# Patient Record
Sex: Female | Born: 1965 | Race: White | Hispanic: No | Marital: Single | Smoking: Former smoker
Health system: Southern US, Academic
[De-identification: ages and names within clinical notes are randomized; demographics above are authoritative.]

## PROBLEM LIST (undated history)

## (undated) DIAGNOSIS — H9313 Tinnitus, bilateral: Secondary | ICD-10-CM

## (undated) DIAGNOSIS — E559 Vitamin D deficiency, unspecified: Secondary | ICD-10-CM

## (undated) DIAGNOSIS — I82409 Acute embolism and thrombosis of unspecified deep veins of unspecified lower extremity: Secondary | ICD-10-CM

## (undated) DIAGNOSIS — M8580 Other specified disorders of bone density and structure, unspecified site: Secondary | ICD-10-CM

## (undated) DIAGNOSIS — M778 Other enthesopathies, not elsewhere classified: Secondary | ICD-10-CM

## (undated) DIAGNOSIS — E039 Hypothyroidism, unspecified: Secondary | ICD-10-CM

## (undated) HISTORY — DX: Acute embolism and thrombosis of unspecified deep veins of unspecified lower extremity: I82.409

## (undated) HISTORY — PX: URETHRAL DILATION: SUR417

## (undated) HISTORY — DX: Other enthesopathies, not elsewhere classified: M77.8

## (undated) HISTORY — DX: Tinnitus, bilateral: H93.13

## (undated) HISTORY — PX: HX DILATION AND CURETTAGE: SHX78

---

## 1998-05-13 ENCOUNTER — Other Ambulatory Visit (HOSPITAL_COMMUNITY): Payer: Self-pay | Admitting: Family Medicine

## 2018-08-27 HISTORY — PX: COLONOSCOPY: WVUENDOPRO10

## 2018-08-27 HISTORY — PX: ESOPHAGOGASTRODUODENOSCOPY: SHX1529

## 2020-03-25 IMAGING — CR XRAY LUMBAR SPINE COMPLETE
1 series · 7 of 7 positions shown · non-contrast
Comparison: None available.

﻿EXAM:  XRAY LUMBAR SPINE COMPLETE
INDICATION: Chronic lower back pain.

[Series 1: view not recorded · 0.17mm/px · 7 of 7 slices shown]
[im 1/7]
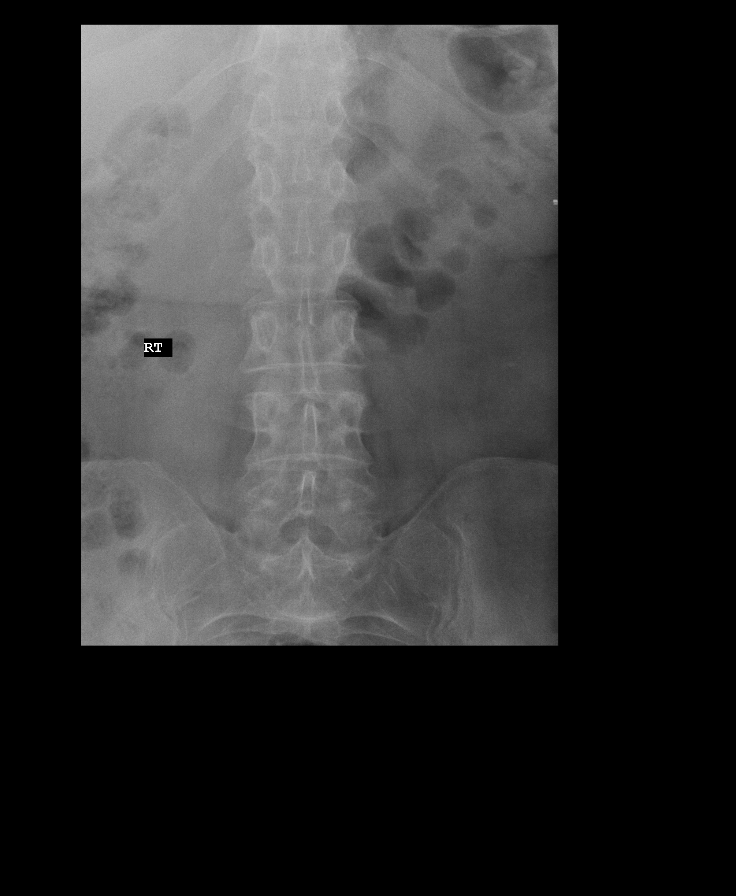
[im 2/7]
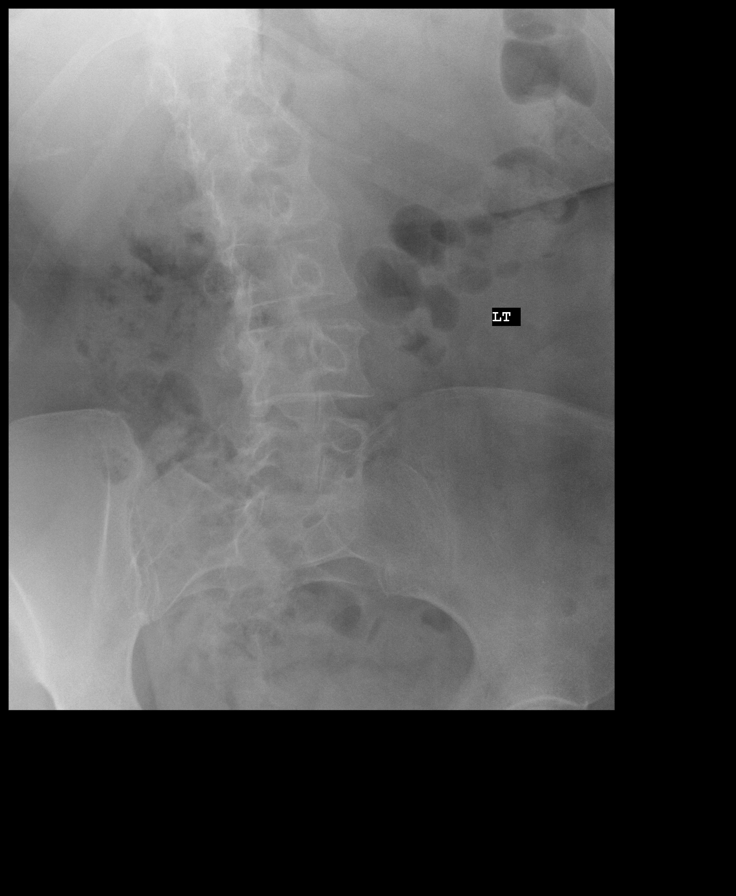
[im 3/7]
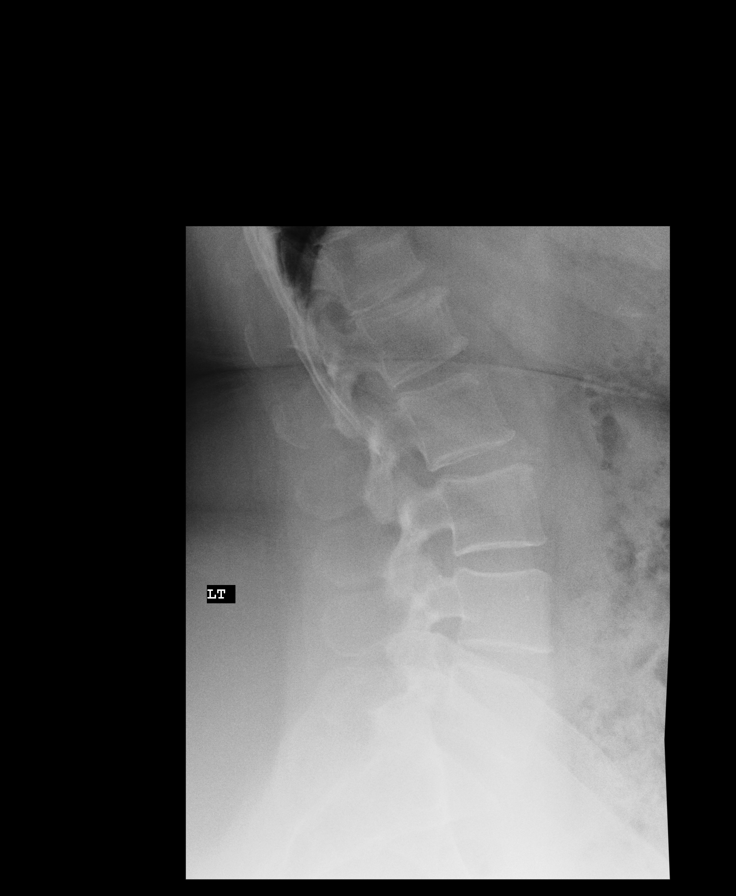
[im 4/7]
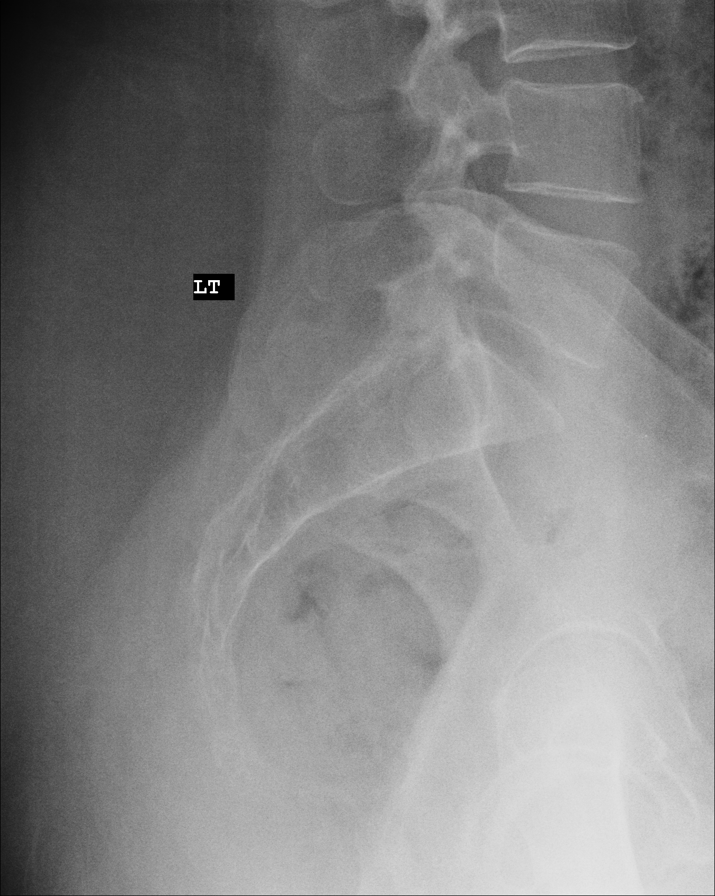
[im 5/7]
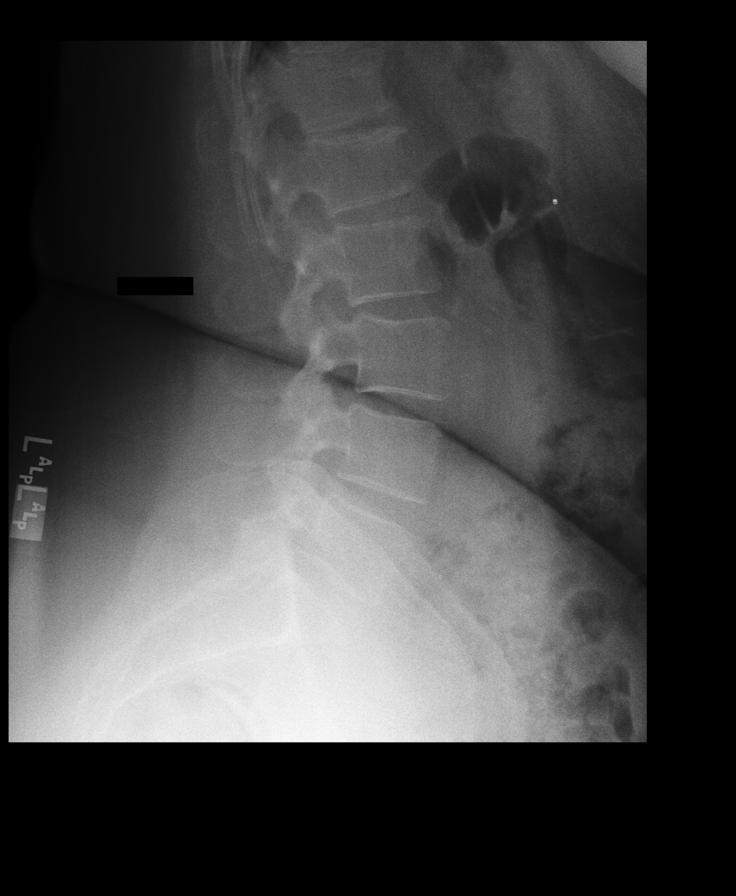
[im 6/7]
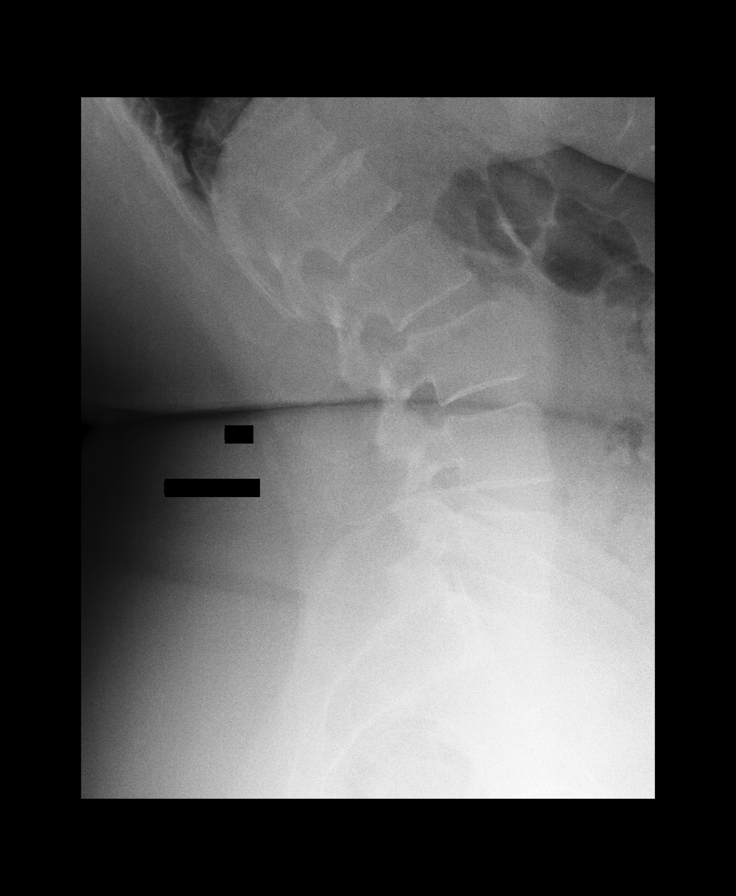
[im 7/7]
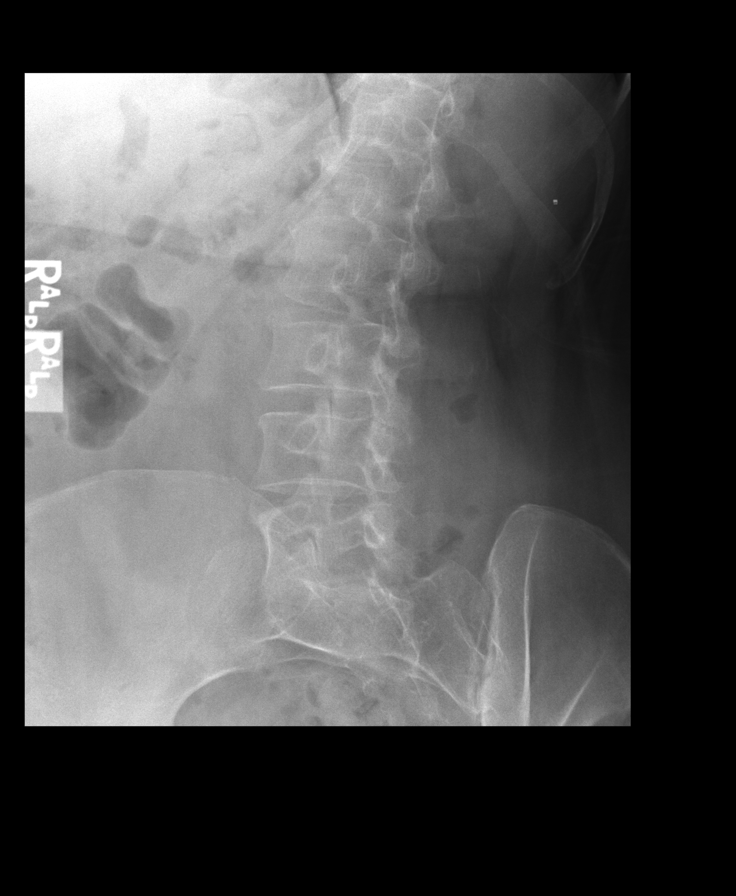

[7 of 7 positions shown; findings below may reference images not displayed]

FINDINGS: Vertebral bodies are normal in height and alignment. There is no acute fracture or subluxation. Disc spaces are well maintained. There is mild facet arthropathy within the lower lumbar spine. There is no definite abnormality on the flexion and extension views. Paraspinal soft tissues are also unremarkable.
IMPRESSION: Unremarkable exam.

## 2021-09-01 ENCOUNTER — Other Ambulatory Visit (INDEPENDENT_AMBULATORY_CARE_PROVIDER_SITE_OTHER): Payer: Self-pay | Admitting: NURSE PRACTITIONER

## 2021-09-13 ENCOUNTER — Encounter (INDEPENDENT_AMBULATORY_CARE_PROVIDER_SITE_OTHER): Payer: Self-pay | Admitting: Family

## 2021-09-13 DIAGNOSIS — M810 Age-related osteoporosis without current pathological fracture: Secondary | ICD-10-CM

## 2021-09-13 DIAGNOSIS — N644 Mastodynia: Secondary | ICD-10-CM

## 2021-09-13 DIAGNOSIS — N943 Premenstrual tension syndrome: Secondary | ICD-10-CM | POA: Insufficient documentation

## 2021-09-13 DIAGNOSIS — E785 Hyperlipidemia, unspecified: Secondary | ICD-10-CM | POA: Insufficient documentation

## 2021-09-13 DIAGNOSIS — I10 Essential (primary) hypertension: Secondary | ICD-10-CM | POA: Insufficient documentation

## 2021-09-13 DIAGNOSIS — K219 Gastro-esophageal reflux disease without esophagitis: Secondary | ICD-10-CM | POA: Insufficient documentation

## 2021-09-13 DIAGNOSIS — E669 Obesity, unspecified: Secondary | ICD-10-CM

## 2021-09-13 DIAGNOSIS — M545 Low back pain, unspecified: Secondary | ICD-10-CM | POA: Insufficient documentation

## 2021-09-13 DIAGNOSIS — M5410 Radiculopathy, site unspecified: Secondary | ICD-10-CM

## 2021-09-13 DIAGNOSIS — E782 Mixed hyperlipidemia: Secondary | ICD-10-CM | POA: Insufficient documentation

## 2021-09-13 DIAGNOSIS — G5601 Carpal tunnel syndrome, right upper limb: Secondary | ICD-10-CM

## 2021-09-13 DIAGNOSIS — B351 Tinea unguium: Secondary | ICD-10-CM

## 2021-09-13 DIAGNOSIS — G540 Brachial plexus disorders: Secondary | ICD-10-CM

## 2021-09-13 DIAGNOSIS — E039 Hypothyroidism, unspecified: Secondary | ICD-10-CM | POA: Insufficient documentation

## 2021-09-13 DIAGNOSIS — L659 Nonscarring hair loss, unspecified: Secondary | ICD-10-CM

## 2021-09-13 DIAGNOSIS — R7303 Prediabetes: Secondary | ICD-10-CM

## 2021-09-15 ENCOUNTER — Other Ambulatory Visit (INDEPENDENT_AMBULATORY_CARE_PROVIDER_SITE_OTHER): Payer: Medicaid Other

## 2021-09-15 ENCOUNTER — Ambulatory Visit (INDEPENDENT_AMBULATORY_CARE_PROVIDER_SITE_OTHER): Payer: Medicaid Other | Admitting: Family

## 2021-09-15 ENCOUNTER — Encounter (INDEPENDENT_AMBULATORY_CARE_PROVIDER_SITE_OTHER): Payer: Self-pay | Admitting: Family

## 2021-09-15 ENCOUNTER — Other Ambulatory Visit: Payer: Self-pay

## 2021-09-15 VITALS — BP 148/92 | HR 76 | Temp 97.7°F | Ht 67.0 in | Wt 252.1 lb

## 2021-09-15 DIAGNOSIS — Z780 Asymptomatic menopausal state: Secondary | ICD-10-CM

## 2021-09-15 DIAGNOSIS — F419 Anxiety disorder, unspecified: Secondary | ICD-10-CM

## 2021-09-15 DIAGNOSIS — M8580 Other specified disorders of bone density and structure, unspecified site: Secondary | ICD-10-CM

## 2021-09-15 DIAGNOSIS — Z6839 Body mass index (BMI) 39.0-39.9, adult: Secondary | ICD-10-CM

## 2021-09-15 DIAGNOSIS — E039 Hypothyroidism, unspecified: Secondary | ICD-10-CM

## 2021-09-15 DIAGNOSIS — R739 Hyperglycemia, unspecified: Secondary | ICD-10-CM

## 2021-09-15 MED ORDER — PANTOPRAZOLE 40 MG TABLET,DELAYED RELEASE
40.0000 mg | DELAYED_RELEASE_TABLET | Freq: Every day | ORAL | 1 refills | Status: DC
Start: 2021-09-15 — End: 2022-03-14

## 2021-09-15 MED ORDER — FAMOTIDINE 40 MG TABLET
40.0000 mg | ORAL_TABLET | Freq: Every evening | ORAL | 2 refills | Status: DC
Start: 2021-09-15 — End: 2022-03-29

## 2021-09-15 MED ORDER — HYDROXYZINE PAMOATE 50 MG CAPSULE
50.0000 mg | ORAL_CAPSULE | Freq: Two times a day (BID) | ORAL | 2 refills | Status: DC | PRN
Start: 2021-09-15 — End: 2022-05-31

## 2021-09-15 NOTE — Progress Notes (Signed)
FAMILY MEDICINE, MEDICAL OFFICE BUILDING  [address]  [place] [address]          Name: Dominique Carrillo MRN:  [id number]   Date: 09/15/2021 Age: 56 y.o.          Provider: Elvera Bicker, FNP-BC    Reason for visit: Follow Up 4 Months      History of Present Illness:  Dominique Carrillo is a 56 y.o. female presenting with worsening GERD.  Patient reports that she is having to take her famotidine earlier in the day.  Continues to use omeprazole 20 mg once daily in the morning.  Patient reports that she did even take 2 omeprazole yesterday without any relief.    Patient also reports that she continues to take hydroxyzine b.i.d. p.r.n. for anxiety as well as allergy issues.  Patient states she has good control with this medication.    05/14/2021: 56 year old female presents for CDM. occ has restless legs but not all the time. No new labs for review. The blood pressure 134/85. The insurance sent her a letter that states if we advise on it she may be approved. She has lost 5 pounds. GERD controlled. she had stomach upset with last antibiotic but it did finally resolved on its own.      Historical Data    Past Medical History:  Past Medical History:   Diagnosis Date   . DVT (deep venous thrombosis) (CMS HCC)     Left Calf   . Tendonitis of wrist, right    . Tinnitus of both ears       Past Surgical History:  Past Surgical History:   Procedure Laterality Date   . COLONOSCOPY  08/27/2018    Dr. Abbe Amsterdam   . ESOPHAGOGASTRODUODENOSCOPY  08/27/2018    Dr. Abbe Amsterdam   . HX DILATION AND CURETTAGE     . URETHRAL DILATION      when 56 years old      Allergies:  Allergies   Allergen Reactions   . Amoxicillin  Other Adverse Reaction (Add comment)     Mouth Blisters/Swelling   . Augmentin [Amoxicillin-Pot Clavulanate]      Blisters in mouth   . Propoxyphene Nausea/ Vomiting     Medications:  Current Outpatient Medications   Medication Sig   . aspirin 81 mg Oral Tablet, Chewable Chew 1 Tablet (81 mg total) Once a day   .  cetirizine (ZYRTEC) 10 mg Oral Tablet Take 1 Tablet (10 mg total) by mouth Once a day   . ergocalciferol, vitamin D2, (DRISDOL) 1,250 mcg (50,000 unit) Oral Capsule Take 1 Capsule (50,000 Units total) by mouth Every 7 days   . famotidine (PEPCID) 40 mg Oral Tablet Take 1 Tablet (40 mg total) by mouth Every evening   . fluticasone propionate (FLONASE) 50 mcg/actuation Nasal Spray, Suspension Administer 2 Sprays into each nostril Once a day   . hydrOXYzine pamoate (VISTARIL) 50 mg Oral Capsule Take 1 Capsule (50 mg total) by mouth Twice per day as needed for Anxiety   . levothyroxine (SYNTHROID) 75 mcg Oral Tablet Take 1 Tablet (75 mcg total) by mouth Every morning   . pantoprazole (PROTONIX) 40 mg Oral Tablet, Delayed Release (E.C.) Take 1 Tablet (40 mg total) by mouth Once a day   . rosuvastatin (CRESTOR) 10 mg Oral Tablet Take 1 Tablet (10 mg total) by mouth Once a day   . spironolacton-hydrochlorothiaz (ALDACTAZIDE) 25-25 mg Oral Tablet TAKE ONE TABLET BY MOUTH EVERY  OTHER DAY     Family History:  Family Medical History:     Problem Relation (Age of Onset)    Elevated Lipids Mother, Father, Sister    Heart Attack Mother    Hypertension (High Blood Pressure) Mother, Father, Sister    Hypothyroidism Mother, Father, Sister          Social History:  Social History     Socioeconomic History   . Marital status: Single   Tobacco Use   . Smoking status: Former     Types: Cigarettes   . Smokeless tobacco: Never   Vaping Use   . Vaping Use: Never used   Substance and Sexual Activity   . Alcohol use: Yes     Comment: Holiday/special occasion.   . Drug use: Never           Review of Systems:  Any pertinent Review of Systems as addressed in the HPI above.    Physical Exam:  Vital Signs:  Vitals:    09/15/21 1632   BP: (!) 148/92   Pulse: 76   Temp: 36.5 C (97.7 F)   SpO2: 98%   Weight: 114 kg (252 lb 2 oz)   Height: 1.702 m (5\' 7" )   BMI: 39.57     Physical Exam  Vitals reviewed.   Constitutional:       General: She is not  in acute distress.     Appearance: Normal appearance. She is obese. She is not ill-appearing, toxic-appearing or diaphoretic.   HENT:      Head: Normocephalic and atraumatic.      Nose: Nose normal.      Mouth/Throat:      Mouth: Mucous membranes are moist.   Eyes:      Extraocular Movements: Extraocular movements intact.   Cardiovascular:      Rate and Rhythm: Normal rate and regular rhythm.      Pulses: Normal pulses.      Heart sounds: Normal heart sounds. No murmur heard.  Pulmonary:      Effort: Pulmonary effort is normal. No respiratory distress.      Breath sounds: Normal breath sounds. No wheezing, rhonchi or rales.   Abdominal:      General: Bowel sounds are normal. There is no distension.      Palpations: Abdomen is soft.      Tenderness: There is no abdominal tenderness.   Musculoskeletal:         General: Normal range of motion.      Cervical back: Normal range of motion.      Right lower leg: No edema.      Left lower leg: No edema.   Skin:     General: Skin is warm and dry.      Coloration: Skin is not jaundiced.      Findings: No rash.   Neurological:      General: No focal deficit present.      Mental Status: She is alert and oriented to person, place, and time.   Psychiatric:         Mood and Affect: Mood normal.         Behavior: Behavior normal.         Thought Content: Thought content normal.         Judgment: Judgment normal.        Data Reviewed today:  Labs:  06/02/2021, hemoglobin A1c 6.2.   Radiology:  11/26/2020-mammogram.  Other provider Notes:  05/14/2021, last  CDM    Assessment:    ICD-10-CM    1. Hyperglycemia  R73.9 HGA1C (HEMOGLOBIN A1C WITH EST AVG GLUCOSE)      2. Anxiety  F41.9       3. Morbid obesity (CMS HCC)  E66.01 LIPID PANEL      4. Hypothyroidism, unspecified type  E03.9 CBC/DIFF     COMPREHENSIVE METABOLIC PNL, FASTING     THYROID STIMULATING HORMONE WITH FREE T4 REFLEX      5. Osteopenia after menopause  M85.80 77080 - DXA BONE DENSITY; AXIAL SKELETON (AMB ONLY)    Z78.0                 Plan:  Orders Placed This Encounter   . 09735 - DXA BONE DENSITY; AXIAL SKELETON (AMB ONLY)   . LIPID PANEL   . CBC/DIFF   . COMPREHENSIVE METABOLIC PNL, FASTING   . THYROID STIMULATING HORMONE WITH FREE T4 REFLEX   . HGA1C (HEMOGLOBIN A1C WITH EST AVG GLUCOSE)   . pantoprazole (PROTONIX) 40 mg Oral Tablet, Delayed Release (E.C.)   . hydrOXYzine pamoate (VISTARIL) 50 mg Oral Capsule   . famotidine (PEPCID) 40 mg Oral Tablet     Discontinue omeprazole.  Start pantoprazole 40 mg once daily in the a.m.Marland Kitchen  Continue famotidine at bedtime.  Patient encouraged to not eat 2 hours before lying down.  Patient encouraged to sleep with head of bed elevated approximately 30.  Patient instructed to avoid foods that are triggers.  Patient advised that caffeine can cause worsening of GERD.    Patient advised to attempt to lose weight.  Patient advised to decrease amount of carbohydrate intake per day to less than 100 g of carbohydrates.  Patient advised to increase protein greater than 90 mg daily.  Patient informed use snacks such as beef sticks, cheese and pepperoni to help her feel fuller.  Patient advised to avoid sweets surgery snacks.  Patient advised to avoid crackers and chips.    Otherwise patient continue current medications including aspirin, Zyrtec, vitamin-D, Flonase, hydroxyzine, Synthroid, Crestor, spironolactone HCT.    Patient obtain fasting labs prior to next appointment.       Return in about 4 months (around 01/15/2022) for In Person Visit, Chronic Care Management.    Elvera Bicker, FNP-BC     Portions of this note may be dictated using voice recognition software or a dictation service. Variances in spelling and vocabulary are possible and unintentional. Not all errors are caught/corrected. Please notify the Thereasa Parkin if any discrepancies are noted or if the meaning of any statement is not clear.

## 2021-09-22 ENCOUNTER — Other Ambulatory Visit (INDEPENDENT_AMBULATORY_CARE_PROVIDER_SITE_OTHER): Payer: Self-pay | Admitting: Family

## 2021-09-22 DIAGNOSIS — M8580 Other specified disorders of bone density and structure, unspecified site: Secondary | ICD-10-CM

## 2021-10-01 ENCOUNTER — Ambulatory Visit
Admission: RE | Admit: 2021-10-01 | Discharge: 2021-10-01 | Disposition: A | Discharge status: Home / Self Care | Payer: Medicaid Other | Source: Ambulatory Visit | Attending: Family | Admitting: Family

## 2021-10-01 ENCOUNTER — Other Ambulatory Visit: Payer: Self-pay

## 2021-10-01 DIAGNOSIS — L819 Disorder of pigmentation, unspecified: Secondary | ICD-10-CM

## 2021-10-01 DIAGNOSIS — M8580 Other specified disorders of bone density and structure, unspecified site: Secondary | ICD-10-CM | POA: Insufficient documentation

## 2021-10-01 NOTE — Result Encounter Note (Signed)
Please call patient inform her that her DEXA scan does indicate that she has osteopenia.  It is imperative the patient continue her vitamin-D supplement as prescribed.  Patient also needs to purchase calcium citrate over-the-counter and take as directed on label.  Will repeat DEXA scan for reassessment years.

## 2021-10-18 ENCOUNTER — Other Ambulatory Visit (INDEPENDENT_AMBULATORY_CARE_PROVIDER_SITE_OTHER): Payer: Self-pay | Admitting: Family

## 2021-10-18 MED ORDER — LEVOTHYROXINE 75 MCG TABLET
75.0000 ug | ORAL_TABLET | Freq: Every morning | ORAL | 1 refills | Status: DC
Start: 2021-10-18 — End: 2022-04-18

## 2021-10-22 ENCOUNTER — Other Ambulatory Visit (INDEPENDENT_AMBULATORY_CARE_PROVIDER_SITE_OTHER): Payer: Self-pay | Admitting: Family

## 2021-10-22 MED ORDER — ROSUVASTATIN 10 MG TABLET
10.0000 mg | ORAL_TABLET | Freq: Every day | ORAL | 1 refills | Status: DC
Start: 2021-10-22 — End: 2022-04-18

## 2021-12-31 ENCOUNTER — Other Ambulatory Visit (INDEPENDENT_AMBULATORY_CARE_PROVIDER_SITE_OTHER): Payer: Self-pay | Admitting: Family Medicine

## 2022-01-17 ENCOUNTER — Encounter (INDEPENDENT_AMBULATORY_CARE_PROVIDER_SITE_OTHER): Payer: Self-pay | Admitting: Family

## 2022-01-25 ENCOUNTER — Other Ambulatory Visit: Payer: Self-pay

## 2022-01-25 ENCOUNTER — Other Ambulatory Visit: Payer: Medicaid Other | Attending: Family | Admitting: Family

## 2022-01-25 ENCOUNTER — Ambulatory Visit (INDEPENDENT_AMBULATORY_CARE_PROVIDER_SITE_OTHER): Payer: Medicaid Other

## 2022-01-25 DIAGNOSIS — E039 Hypothyroidism, unspecified: Secondary | ICD-10-CM

## 2022-01-25 LAB — COMPREHENSIVE METABOLIC PNL, FASTING
ALBUMIN/GLOBULIN RATIO: 1.5 — ABNORMAL HIGH (ref 0.8–1.4)
ALBUMIN: 4.6 g/dL (ref 3.5–5.7)
ALKALINE PHOSPHATASE: 79 U/L (ref 34–104)
ALT (SGPT): 22 U/L (ref 7–52)
ANION GAP: 8 mmol/L — ABNORMAL LOW (ref 10–20)
AST (SGOT): 21 U/L (ref 13–39)
BILIRUBIN TOTAL: 0.8 mg/dL (ref 0.3–1.2)
BUN/CREA RATIO: 11 (ref 6–22)
BUN: 10 mg/dL (ref 7–25)
CALCIUM, CORRECTED: 9.3 mg/dL (ref 8.9–10.8)
CALCIUM: 9.9 mg/dL (ref 8.6–10.3)
CHLORIDE: 103 mmol/L (ref 98–107)
CO2 TOTAL: 31 mmol/L (ref 21–31)
CREATININE: 0.9 mg/dL (ref 0.60–1.30)
ESTIMATED GFR: 76 mL/min/{1.73_m2} (ref >59)
GLOBULIN: 3 (ref 2.9–5.4)
GLUCOSE: 104 mg/dL (ref 74–109)
OSMOLALITY, CALCULATED: 283 mOsm/kg (ref 270–290)
POTASSIUM: 4 mmol/L (ref 3.5–5.1)
PROTEIN TOTAL: 7.6 g/dL (ref 6.4–8.9)
SODIUM: 142 mmol/L (ref 136–145)

## 2022-01-25 LAB — CBC WITH DIFF
BASOPHIL #: 0.1 10*3/uL (ref 0.00–0.30)
BASOPHIL %: 1 % (ref 0–3)
EOSINOPHIL #: 0.1 10*3/uL (ref 0.00–0.80)
EOSINOPHIL %: 3 % (ref 0–7)
HCT: 39.4 % (ref 37.0–47.0)
HGB: 13.7 g/dL (ref 12.5–16.0)
LYMPHOCYTE #: 1.2 10*3/uL (ref 1.10–5.00)
LYMPHOCYTE %: 28 % (ref 25–45)
MCH: 30.7 pg (ref 27.0–32.0)
MCHC: 34.7 g/dL (ref 32.0–36.0)
MCV: 88.6 fL (ref 78.0–99.0)
MONOCYTE #: 0.3 10*3/uL (ref 0.00–1.30)
MONOCYTE %: 6 % (ref 0–12)
MPV: 7.4 fL (ref 7.4–10.4)
NEUTROPHIL #: 2.7 10*3/uL (ref 1.80–8.40)
NEUTROPHIL %: 61 % (ref 40–76)
PLATELETS: 261 10*3/uL (ref 140–440)
RBC: 4.45 10*6/uL (ref 4.20–5.40)
RDW: 13.6 % (ref 11.6–14.8)
WBC: 4.5 10*3/uL (ref 4.0–10.5)
WBCS UNCORRECTED: 4.5 10*3/uL

## 2022-01-25 LAB — THYROID STIMULATING HORMONE WITH FREE T4 REFLEX: TSH: 3.351 u[IU]/mL (ref 0.450–5.330)

## 2022-01-25 LAB — LIPID PANEL
CHOL/HDL RATIO: 2.7
CHOLESTEROL: 146 mg/dL (ref <200)
HDL CHOL: 55 mg/dL (ref 23–92)
LDL CALC: 70 mg/dL (ref 0–100)
TRIGLYCERIDES: 106 mg/dL (ref <=150)
VLDL CALC: 21 mg/dL (ref 0–50)

## 2022-01-26 ENCOUNTER — Ambulatory Visit (INDEPENDENT_AMBULATORY_CARE_PROVIDER_SITE_OTHER): Payer: Medicaid Other | Admitting: Family

## 2022-01-26 ENCOUNTER — Encounter (INDEPENDENT_AMBULATORY_CARE_PROVIDER_SITE_OTHER): Payer: Self-pay | Admitting: Family

## 2022-01-26 VITALS — BP 123/81 | HR 75 | Temp 98.7°F | Resp 18 | Ht 67.0 in | Wt 254.4 lb

## 2022-01-26 DIAGNOSIS — E039 Hypothyroidism, unspecified: Secondary | ICD-10-CM

## 2022-01-26 DIAGNOSIS — R7303 Prediabetes: Secondary | ICD-10-CM

## 2022-01-26 DIAGNOSIS — Z6839 Body mass index (BMI) 39.0-39.9, adult: Secondary | ICD-10-CM

## 2022-01-26 DIAGNOSIS — E782 Mixed hyperlipidemia: Secondary | ICD-10-CM

## 2022-01-26 DIAGNOSIS — M5410 Radiculopathy, site unspecified: Secondary | ICD-10-CM

## 2022-01-26 DIAGNOSIS — M8580 Other specified disorders of bone density and structure, unspecified site: Secondary | ICD-10-CM

## 2022-01-26 DIAGNOSIS — K219 Gastro-esophageal reflux disease without esophagitis: Secondary | ICD-10-CM

## 2022-01-26 DIAGNOSIS — I10 Essential (primary) hypertension: Secondary | ICD-10-CM

## 2022-01-26 DIAGNOSIS — E669 Obesity, unspecified: Secondary | ICD-10-CM

## 2022-01-26 NOTE — Progress Notes (Signed)
FAMILY MEDICINE, MEDICAL OFFICE BUILDING  [address]  [place] [address]          Name: Dominique Carrillo MRN:  [id number]   Date: 01/26/2022 Age: 56 y.o.          Provider: Elvera Bicker, FNP-BC    Reason for visit: Follow Up 3 Months (4 month CDM/Patient is not having any new problems/issues or concerns at this time. )      History of Present Illness:  Dominique Carrillo is a 56 y.o. female presenting for chronic disease management.  Patient reports that her GERD is under better control but still has to occasionally take her Pepcid dose early in the evening depending on what she has eaten.  BP under good control 123/81.      Historical Data    Past Medical History:  Past Medical History:   Diagnosis Date    DVT (deep venous thrombosis) (CMS HCC)     Left Calf    Tendonitis of wrist, right     Tinnitus of both ears       Past Surgical History:  Past Surgical History:   Procedure Laterality Date    COLONOSCOPY  08/27/2018    Dr. Abbe Amsterdam    ESOPHAGOGASTRODUODENOSCOPY  08/27/2018    Dr. Arley Phenix DILATION AND CURETTAGE      URETHRAL DILATION      when 56 years old      Allergies:  Allergies   Allergen Reactions    Amoxicillin  Other Adverse Reaction (Add comment)     Mouth Blisters/Swelling    Augmentin [Amoxicillin-Pot Clavulanate]      Blisters in mouth    Propoxyphene Nausea/ Vomiting     Medications:  Current Outpatient Medications   Medication Sig    aspirin 81 mg Oral Tablet, Chewable Chew 1 Tablet (81 mg total) Once a day    calcium citrate-vitamin D3 (CITRACAL) 200 mg-6.25 mcg (250 unit) Oral Tablet Take by mouth Twice daily    cetirizine (ZYRTEC) 10 mg Oral Tablet Take 1 Tablet (10 mg total) by mouth Once a day    ergocalciferol, vitamin D2, (DRISDOL) 1,250 mcg (50,000 unit) Oral Capsule TAKE ONE CAPSULE BY MOUTH ONCE WEEKLY    famotidine (PEPCID) 40 mg Oral Tablet Take 1 Tablet (40 mg total) by mouth Every evening    fluticasone propionate (FLONASE) 50 mcg/actuation Nasal Spray, Suspension  Administer 2 Sprays into each nostril Once a day    hydrOXYzine pamoate (VISTARIL) 50 mg Oral Capsule Take 1 Capsule (50 mg total) by mouth Twice per day as needed for Anxiety    Ibuprofen (MOTRIN) 800 mg Oral Tablet Take 1 Tablet (800 mg total) by mouth Three times a day as needed for Pain    levothyroxine (SYNTHROID) 75 mcg Oral Tablet Take 1 Tablet (75 mcg total) by mouth Every morning    pantoprazole (PROTONIX) 40 mg Oral Tablet, Delayed Release (E.C.) Take 1 Tablet (40 mg total) by mouth Once a day    rosuvastatin (CRESTOR) 10 mg Oral Tablet Take 1 Tablet (10 mg total) by mouth Once a day    spironolacton-hydrochlorothiaz (ALDACTAZIDE) 25-25 mg Oral Tablet TAKE ONE TABLET BY MOUTH EVERY OTHER DAY (Patient taking differently: Take 1 Tablet by mouth Once per day as needed)     Family History:  Family Medical History:       Problem Relation (Age of Onset)    Elevated Lipids Mother, Father, Sister    Heart Attack  Mother    Hypertension (High Blood Pressure) Mother, Father, Sister    Hypothyroidism Mother, Father, Sister            Social History:  Social History     Socioeconomic History    Marital status: Single   Tobacco Use    Smoking status: Former     Types: Cigarettes    Smokeless tobacco: Never   Vaping Use    Vaping Use: Never used   Substance and Sexual Activity    Alcohol use: Yes     Comment: Holiday/special occasion.    Drug use: Never             Review of Systems:  Any pertinent Review of Systems as addressed in the HPI above.    Physical Exam:  Vital Signs:  Vitals:    01/26/22 1617   BP: 123/81   Pulse: 75   Resp: 18   Temp: 37.1 C (98.7 F)   SpO2: 98%   Weight: 115 kg (254 lb 6.4 oz)   Height: 1.702 m (5\' 7" )   BMI: 39.93     Physical Exam  Vitals reviewed.   Constitutional:       General: She is not in acute distress.     Appearance: Normal appearance. She is obese. She is not ill-appearing, toxic-appearing or diaphoretic.   HENT:      Head: Normocephalic and atraumatic.      Nose: Nose normal.       Mouth/Throat:      Mouth: Mucous membranes are moist.   Eyes:      Extraocular Movements: Extraocular movements intact.      Conjunctiva/sclera: Conjunctivae normal.   Cardiovascular:      Rate and Rhythm: Normal rate and regular rhythm.      Pulses: Normal pulses.      Heart sounds: Normal heart sounds. No murmur heard.  Pulmonary:      Effort: Pulmonary effort is normal. No respiratory distress.      Breath sounds: Normal breath sounds. No wheezing, rhonchi or rales.   Abdominal:      General: Bowel sounds are normal.      Palpations: Abdomen is soft.      Tenderness: There is no abdominal tenderness.   Musculoskeletal:         General: Normal range of motion.      Cervical back: Normal range of motion.   Skin:     General: Skin is warm and dry.      Coloration: Skin is not jaundiced.      Findings: No rash.   Neurological:      General: No focal deficit present.      Mental Status: She is alert and oriented to person, place, and time.   Psychiatric:         Mood and Affect: Mood normal.         Behavior: Behavior normal.         Thought Content: Thought content normal.         Judgment: Judgment normal.        Data Reviewed today:  Labs:   Last CBC  (Last result in the past 2 years)        WBC   HGB   HCT   MCV   Platelets      01/25/22 1027 4.5   13.7   39.4   88.6   261  Lab Results   Component Value Date    SODIUM 142 01/25/2022    POTASSIUM 4.0 01/25/2022    CHLORIDE 103 01/25/2022    CO2 31 01/25/2022    ANIONGAP 8 (L) 01/25/2022    BUN 10 01/25/2022    CREATININE 0.90 01/25/2022    BUNCRRATIO 11 01/25/2022    GFR 76 01/25/2022    GLUCOSENF 104 01/25/2022     Lab Results   Component Value Date    TSH 3.351 01/25/2022     Last Lipid Panel         Cholesterol   HDL   LDL   Direct LDL   Triglycerides      01/25/22 1027 146   55   70     106            Lab Results   Component Value Date    AST 21 01/25/2022    ALT 22 01/25/2022       Radiology: DEXA scan  10/01/2021  IMPRESSION:  Osteopenia,  patient is at increased risk for developing osteoporosis.  Other provider Notes:     Assessment and Plan:  Maela was seen today for follow up 3 months.    Diagnoses and all orders for this visit:    Osteopenia, unspecified location    Gastroesophageal reflux disease, unspecified whether esophagitis present    Hypertension, benign    Mixed hyperlipidemia    Back pain with radiculopathy    Hypothyroidism, unspecified type    Obesity, unspecified classification, unspecified obesity type, unspecified whether serious comorbidity present    Pre-diabetes         Patient to continue calcium and vitamin D, repeat Bone density scan in 2 years.  Patient advised to avoid foods that trigger her acid reflux/heartburn, stop eating 2 hours before bedtime, keep head of bed elevated 30 degrees.  Repeat fasting labs prior to next appointment, will include A1C as it was not completed with yesterday's labs.  All questions were answered to the satisfaction of the patient while in the room.     Return in about 4 months (around 05/29/2022) for In Person Visit, Chronic Care Management.    Elvera Bicker, FNP-BC     Portions of this note may be dictated using voice recognition software or a dictation service. Variances in spelling and vocabulary are possible and unintentional. Not all errors are caught/corrected. Please notify the Thereasa Parkin if any discrepancies are noted or if the meaning of any statement is not clear.

## 2022-02-14 ENCOUNTER — Other Ambulatory Visit (INDEPENDENT_AMBULATORY_CARE_PROVIDER_SITE_OTHER): Payer: Self-pay | Admitting: Family

## 2022-02-14 MED ORDER — SPIRONOLACTONE 25 MG-HYDROCHLOROTHIAZIDE 25 MG TABLET
1.0000 | ORAL_TABLET | ORAL | 2 refills | Status: DC
Start: 2022-02-14 — End: 2022-05-31

## 2022-03-14 ENCOUNTER — Other Ambulatory Visit (INDEPENDENT_AMBULATORY_CARE_PROVIDER_SITE_OTHER): Payer: Self-pay | Admitting: Family

## 2022-03-14 MED ORDER — PANTOPRAZOLE 40 MG TABLET,DELAYED RELEASE
40.0000 mg | DELAYED_RELEASE_TABLET | Freq: Every day | ORAL | 1 refills | Status: DC
Start: 2022-03-14 — End: 2022-05-31

## 2022-03-22 DIAGNOSIS — R5383 Other fatigue: Secondary | ICD-10-CM | POA: Insufficient documentation

## 2022-03-22 DIAGNOSIS — E559 Vitamin D deficiency, unspecified: Secondary | ICD-10-CM | POA: Insufficient documentation

## 2022-03-28 ENCOUNTER — Other Ambulatory Visit (INDEPENDENT_AMBULATORY_CARE_PROVIDER_SITE_OTHER): Payer: Self-pay | Admitting: Family Medicine

## 2022-03-29 ENCOUNTER — Other Ambulatory Visit (INDEPENDENT_AMBULATORY_CARE_PROVIDER_SITE_OTHER): Payer: Self-pay | Admitting: Family

## 2022-03-29 MED ORDER — FAMOTIDINE 40 MG TABLET
40.0000 mg | ORAL_TABLET | Freq: Every evening | ORAL | 2 refills | Status: DC
Start: 2022-03-29 — End: 2022-05-31

## 2022-04-18 ENCOUNTER — Other Ambulatory Visit (INDEPENDENT_AMBULATORY_CARE_PROVIDER_SITE_OTHER): Payer: Self-pay | Admitting: Family Medicine

## 2022-04-18 MED ORDER — ROSUVASTATIN 10 MG TABLET
10.0000 mg | ORAL_TABLET | Freq: Every day | ORAL | 0 refills | Status: DC
Start: 2022-04-18 — End: 2022-05-31

## 2022-04-18 MED ORDER — LEVOTHYROXINE 75 MCG TABLET
75.0000 ug | ORAL_TABLET | Freq: Every morning | ORAL | 0 refills | Status: DC
Start: 2022-04-18 — End: 2022-05-31

## 2022-05-30 ENCOUNTER — Ambulatory Visit (INDEPENDENT_AMBULATORY_CARE_PROVIDER_SITE_OTHER): Payer: Medicaid Other

## 2022-05-30 ENCOUNTER — Other Ambulatory Visit: Payer: Self-pay

## 2022-05-30 ENCOUNTER — Other Ambulatory Visit: Payer: Medicaid Other | Attending: Family Medicine | Admitting: Family Medicine

## 2022-05-30 DIAGNOSIS — E039 Hypothyroidism, unspecified: Secondary | ICD-10-CM

## 2022-05-30 DIAGNOSIS — E669 Obesity, unspecified: Secondary | ICD-10-CM | POA: Insufficient documentation

## 2022-05-30 DIAGNOSIS — E782 Mixed hyperlipidemia: Secondary | ICD-10-CM

## 2022-05-30 DIAGNOSIS — R7303 Prediabetes: Secondary | ICD-10-CM

## 2022-05-30 DIAGNOSIS — I10 Essential (primary) hypertension: Secondary | ICD-10-CM

## 2022-05-30 LAB — CBC WITH DIFF
BASOPHIL #: 0 10*3/uL (ref 0.00–0.10)
BASOPHIL %: 1 % (ref 0–1)
EOSINOPHIL #: 0.1 10*3/uL (ref 0.00–0.50)
EOSINOPHIL %: 3 %
HCT: 40.5 % (ref 31.2–41.9)
HGB: 14 g/dL (ref 10.9–14.3)
LYMPHOCYTE #: 1.1 10*3/uL (ref 1.00–3.00)
LYMPHOCYTE %: 25 % (ref 16–44)
MCH: 30.7 pg (ref 24.7–32.8)
MCHC: 34.6 g/dL (ref 32.3–35.6)
MCV: 88.9 fL (ref 75.5–95.3)
MONOCYTE #: 0.4 10*3/uL (ref 0.30–1.00)
MONOCYTE %: 9 % (ref 5–13)
MPV: 7.9 fL (ref 7.9–10.8)
NEUTROPHIL #: 2.7 10*3/uL (ref 1.85–7.80)
NEUTROPHIL %: 62 % (ref 43–77)
PLATELETS: 266 10*3/uL (ref 140–440)
RBC: 4.56 10*6/uL (ref 3.63–4.92)
RDW: 13.1 % (ref 12.3–17.7)
WBC: 4.3 10*3/uL (ref 3.8–11.8)

## 2022-05-30 LAB — COMPREHENSIVE METABOLIC PNL, FASTING
ALBUMIN/GLOBULIN RATIO: 1.5 — ABNORMAL HIGH (ref 0.8–1.4)
ALBUMIN: 4.9 g/dL (ref 3.5–5.7)
ALKALINE PHOSPHATASE: 79 U/L (ref 34–104)
ALT (SGPT): 22 U/L (ref 7–52)
ANION GAP: 8 mmol/L (ref 4–13)
AST (SGOT): 22 U/L (ref 13–39)
BILIRUBIN TOTAL: 0.9 mg/dL (ref 0.3–1.2)
BUN/CREA RATIO: 18 (ref 6–22)
BUN: 17 mg/dL (ref 7–25)
CALCIUM, CORRECTED: 9.3 mg/dL (ref 8.9–10.8)
CALCIUM: 10 mg/dL (ref 8.6–10.3)
CHLORIDE: 101 mmol/L (ref 98–107)
CO2 TOTAL: 32 mmol/L — ABNORMAL HIGH (ref 21–31)
CREATININE: 0.92 mg/dL (ref 0.60–1.30)
ESTIMATED GFR: 74 mL/min/{1.73_m2} (ref >59)
GLOBULIN: 3.3 (ref 2.9–5.4)
GLUCOSE: 105 mg/dL (ref 74–109)
OSMOLALITY, CALCULATED: 283 mOsm/kg (ref 270–290)
POTASSIUM: 3.2 mmol/L — ABNORMAL LOW (ref 3.5–5.1)
PROTEIN TOTAL: 8.2 g/dL (ref 6.4–8.9)
SODIUM: 141 mmol/L (ref 136–145)

## 2022-05-30 LAB — LIPID PANEL
CHOL/HDL RATIO: 3.3
CHOLESTEROL: 154 mg/dL (ref <200)
HDL CHOL: 47 mg/dL (ref 23–92)
LDL CALC: 81 mg/dL (ref 0–100)
TRIGLYCERIDES: 130 mg/dL (ref <=150)
VLDL CALC: 26 mg/dL (ref 0–50)

## 2022-05-30 LAB — THYROID STIMULATING HORMONE (SENSITIVE TSH): TSH: 2.691 u[IU]/mL (ref 0.450–5.330)

## 2022-05-30 LAB — HGA1C (HEMOGLOBIN A1C WITH EST AVG GLUCOSE): HEMOGLOBIN A1C: 6 % (ref 4.0–6.0)

## 2022-05-31 ENCOUNTER — Ambulatory Visit (INDEPENDENT_AMBULATORY_CARE_PROVIDER_SITE_OTHER): Payer: Medicaid Other | Admitting: Family Medicine

## 2022-05-31 ENCOUNTER — Encounter (INDEPENDENT_AMBULATORY_CARE_PROVIDER_SITE_OTHER): Payer: Self-pay | Admitting: Family Medicine

## 2022-05-31 VITALS — BP 131/78 | HR 66 | Temp 97.5°F | Resp 18 | Ht 67.0 in | Wt 241.6 lb

## 2022-05-31 DIAGNOSIS — E782 Mixed hyperlipidemia: Secondary | ICD-10-CM

## 2022-05-31 DIAGNOSIS — Z23 Encounter for immunization: Secondary | ICD-10-CM

## 2022-05-31 DIAGNOSIS — E876 Hypokalemia: Secondary | ICD-10-CM | POA: Insufficient documentation

## 2022-05-31 DIAGNOSIS — E039 Hypothyroidism, unspecified: Secondary | ICD-10-CM

## 2022-05-31 DIAGNOSIS — K219 Gastro-esophageal reflux disease without esophagitis: Secondary | ICD-10-CM

## 2022-05-31 DIAGNOSIS — I10 Essential (primary) hypertension: Secondary | ICD-10-CM

## 2022-05-31 DIAGNOSIS — M810 Age-related osteoporosis without current pathological fracture: Secondary | ICD-10-CM

## 2022-05-31 MED ORDER — PANTOPRAZOLE 40 MG TABLET,DELAYED RELEASE
40.0000 mg | DELAYED_RELEASE_TABLET | Freq: Every day | ORAL | 0 refills | Status: AC
Start: 2022-05-31 — End: 2022-08-29

## 2022-05-31 MED ORDER — SPIRONOLACTONE 25 MG-HYDROCHLOROTHIAZIDE 25 MG TABLET
1.0000 | ORAL_TABLET | ORAL | 1 refills | Status: DC
Start: 2022-05-31 — End: 2023-02-08

## 2022-05-31 MED ORDER — HYDROXYZINE PAMOATE 50 MG CAPSULE
50.0000 mg | ORAL_CAPSULE | Freq: Two times a day (BID) | ORAL | 1 refills | Status: AC | PRN
Start: 2022-05-31 — End: 2022-08-29

## 2022-05-31 MED ORDER — FAMOTIDINE 40 MG TABLET
40.0000 mg | ORAL_TABLET | Freq: Every evening | ORAL | 0 refills | Status: DC
Start: 2022-05-31 — End: 2022-09-19

## 2022-05-31 MED ORDER — ERGOCALCIFEROL (VITAMIN D2) 1,250 MCG (50,000 UNIT) CAPSULE
50000.0000 [IU] | ORAL_CAPSULE | ORAL | 0 refills | Status: DC
Start: 2022-05-31 — End: 2022-09-12

## 2022-05-31 MED ORDER — ROSUVASTATIN 10 MG TABLET
10.0000 mg | ORAL_TABLET | Freq: Every day | ORAL | 0 refills | Status: DC
Start: 2022-05-31 — End: 2022-09-29

## 2022-05-31 MED ORDER — LEVOTHYROXINE 75 MCG TABLET
75.0000 ug | ORAL_TABLET | Freq: Every morning | ORAL | 0 refills | Status: DC
Start: 2022-05-31 — End: 2022-09-29

## 2022-05-31 NOTE — Progress Notes (Signed)
FAMILY MEDICINE, MEDICAL OFFICE BUILDING  [address]  [place] [address]       Name: Dominique Carrillo MRN:  [id number]   Date: 05/31/2022 Age: 56 y.o.          Provider: Mickey Farber, DO    Reason for visit: Follow Up 4 Months      History of Present Illness:  05/31/2022:  This 56 year old female presents for 4 month follow-up to review her labs and get refills on all of her medications.  She is lost 13 lb since she was here last.  She denies chest pain or shortness a breath and is tolerating her medications well.  I reviewed her labs which revealed she had a normal CBC with a hemoglobin of 14 hematocrit of 40.5.  Her sodium was 141 potassium was low at 3.2 so I gave her a list of foods that are high in potassium glucose was 105 BUN 17 creatinine 0.9 2 GFR 74 calcium 10.0 cholesterol was 154 HDL 47 LDL 81 triglycerides went from 106-130.  TSH was 2.691 liver enzymes were normal hemoglobin A1c was excellent at 6 corrected calcium was 9.3.  Historical Data    Past Medical History:  Past Medical History:   Diagnosis Date    DVT (deep venous thrombosis) (CMS HCC)     Left Calf    Tendonitis of wrist, right     Tinnitus of both ears          Past Surgical History:  Past Surgical History:   Procedure Laterality Date    COLONOSCOPY  08/27/2018    Dr. Abbe Amsterdam    ESOPHAGOGASTRODUODENOSCOPY  08/27/2018    Dr. Arley Phenix DILATION AND CURETTAGE      URETHRAL DILATION      when 56 years old         Allergies:  Allergies   Allergen Reactions    Amoxicillin  Other Adverse Reaction (Add comment)     Mouth Blisters/Swelling    Augmentin [Amoxicillin-Pot Clavulanate]      Blisters in mouth    Propoxyphene Nausea/ Vomiting     Medications:  Current Outpatient Medications   Medication Sig    aspirin 81 mg Oral Tablet, Chewable Chew 1 Tablet (81 mg total) Once a day    calcium citrate-vitamin D3 (CITRACAL) 200 mg-6.25 mcg (250 unit) Oral Tablet Take by mouth Twice daily    cetirizine (ZYRTEC) 10 mg Oral Tablet Take 1  Tablet (10 mg total) by mouth Once a day    ergocalciferol, vitamin D2, (DRISDOL) 1,250 mcg (50,000 unit) Oral Capsule Take 1 Capsule (50,000 Units total) by mouth Every 7 days for 90 days    famotidine (PEPCID) 40 mg Oral Tablet Take 1 Tablet (40 mg total) by mouth Every evening for 90 days    fluticasone propionate (FLONASE) 50 mcg/actuation Nasal Spray, Suspension Administer 2 Sprays into each nostril Once a day    hydrOXYzine pamoate (VISTARIL) 50 mg Oral Capsule Take 1 Capsule (50 mg total) by mouth Twice per day as needed for Anxiety for up to 90 days    Ibuprofen (MOTRIN) 800 mg Oral Tablet Take 1 Tablet (800 mg total) by mouth Three times a day as needed for Pain    levothyroxine (SYNTHROID) 75 mcg Oral Tablet Take 1 Tablet (75 mcg total) by mouth Every morning for 90 days    pantoprazole (PROTONIX) 40 mg Oral Tablet, Delayed Release (E.C.) Take 1 Tablet (40 mg total) by mouth Once  a day for 90 days    rosuvastatin (CRESTOR) 10 mg Oral Tablet Take 1 Tablet (10 mg total) by mouth Once a day for 90 days    spironolacton-hydrochlorothiaz (ALDACTAZIDE) 25-25 mg Oral Tablet Take 1 Tablet by mouth Every other day for 270 days     Family History:  Family Medical History:       Problem Relation (Age of Onset)    Elevated Lipids Mother, Father, Sister    Heart Attack Mother    Hypertension (High Blood Pressure) Mother, Father, Sister    Hypothyroidism Mother, Father, Sister            Social History:  Social History     Socioeconomic History    Marital status: Single   Tobacco Use    Smoking status: Former     Types: Cigarettes    Smokeless tobacco: Never   Vaping Use    Vaping Use: Never used   Substance and Sexual Activity    Alcohol use: Yes     Comment: Holiday/special occasion.    Drug use: Never           Review of Systems:  Any pertinent Review of Systems as addressed in the HPI above.    Physical Exam:  Vital Signs:  Vitals:    05/31/22 1605   BP: 131/78   Pulse: 66   Resp: 18   Temp: 36.4 C (97.5 F)    SpO2: 97%   Weight: 110 kg (241 lb 9.6 oz)   Height: 1.702 m (5\' 7" )   BMI: 37.92     Physical Exam  Vitals reviewed.   Constitutional:       General: She is awake.      Appearance: Normal appearance. She is well-developed and well-groomed. She is obese.      Comments: BMI is elevated at 37.84 kg per m2 which is class 2 obesity.   HENT:      Head: Normocephalic and atraumatic.      Right Ear: Tympanic membrane, ear canal and external ear normal.      Left Ear: Tympanic membrane, ear canal and external ear normal.      Nose: Nose normal.      Mouth/Throat:      Mouth: Mucous membranes are moist.      Pharynx: Oropharynx is clear.   Eyes:      Extraocular Movements: Extraocular movements intact.      Conjunctiva/sclera: Conjunctivae normal.      Pupils: Pupils are equal, round, and reactive to light.   Cardiovascular:      Rate and Rhythm: Normal rate and regular rhythm.      Heart sounds: Normal heart sounds, S1 normal and S2 normal.   Pulmonary:      Effort: Pulmonary effort is normal.      Breath sounds: Normal breath sounds.   Abdominal:      General: Abdomen is protuberant. Bowel sounds are normal.      Palpations: Abdomen is soft.   Musculoskeletal:         General: Normal range of motion.      Cervical back: Normal range of motion and neck supple.   Skin:     General: Skin is warm and dry.      Capillary Refill: Capillary refill takes less than 2 seconds.   Neurological:      General: No focal deficit present.      Mental Status: She is alert and oriented to person,  place, and time. Mental status is at baseline.   Psychiatric:         Mood and Affect: Mood normal.         Behavior: Behavior normal. Behavior is cooperative.         Thought Content: Thought content normal.         Judgment: Judgment normal.       Assessment:    ICD-10-CM    1. Hypertension, benign  I10 CBC/DIFF     BASIC METABOLIC PANEL     URINALYSIS, MACROSCOPIC AND MICROSCOPIC W/CULTURE REFLEX     LIPID PANEL     HEPATIC FUNCTION PANEL      THYROID STIMULATING HORMONE (SENSITIVE TSH)      2. Mixed hyperlipidemia  E78.2       3. Gastroesophageal reflux disease, unspecified whether esophagitis present  K21.9       4. Acquired hypothyroidism  E03.9       5. Age-related osteoporosis without current pathological fracture  M81.0       6. Hypokalemia  E87.6          Plan:  Orders Placed This Encounter    Flu Vaccine, 6 month-adult,0.5 mL IM (Admin)    CBC/DIFF    BASIC METABOLIC PANEL    URINALYSIS, MACROSCOPIC AND MICROSCOPIC W/CULTURE REFLEX    LIPID PANEL    HEPATIC FUNCTION PANEL    THYROID STIMULATING HORMONE (SENSITIVE TSH)    ergocalciferol, vitamin D2, (DRISDOL) 1,250 mcg (50,000 unit) Oral Capsule    famotidine (PEPCID) 40 mg Oral Tablet    hydrOXYzine pamoate (VISTARIL) 50 mg Oral Capsule    levothyroxine (SYNTHROID) 75 mcg Oral Tablet    pantoprazole (PROTONIX) 40 mg Oral Tablet, Delayed Release (E.C.)    rosuvastatin (CRESTOR) 10 mg Oral Tablet    spironolacton-hydrochlorothiaz (ALDACTAZIDE) 25-25 mg Oral Tablet     Today I refilled her vitamin-D famotidine hydroxyzine pamoate levothyroxine pantoprazole spironolactone hydrochlorothiazide rosuvastatin and I gave her a list of foods that are high in potassium.  She did get a flu shot today.  Today I ordered for 4 months from now CBC with diff basic metabolic panel urinalysis lipid panel hepatic function panel thyroid-stimulating hormone.  The patient should stay on a heart healthy low-fat low-cholesterol diet and avoid high fructose corn syrup and concentrated sweets.  More than 50% of the visit was spent counseling and coordinating care.  All questions were answered to his satisfaction of the patient.  She should limit salt to no more than 2 g a day.  She should exercise 30 minutes a day 3 to 5 times a week.  I told her it is fine to eat all the fresh Spanish she can eat.      Return in about 4 months (around 09/29/2022).    Mickey Farber, DO     Portions of this note may be dictated using voice  recognition software or a dictation service. Variances in spelling and vocabulary are possible and unintentional. Not all errors are caught/corrected. Please notify the [facility] if any discrepancies are noted or if the meaning of any statement is not clear.

## 2022-05-31 NOTE — Nursing Note (Signed)
05/31/22 1609   Recent Weight Change   Have you had a recent unexplained weight loss or gain? N   Health Education and Literacy   How often do you have a problem understanding what is told to you about your medical condition?  Never   Domestic Violence   Because we are aware of abuse and domestic violence today, we ask all patients: Are you being hurt, hit, or frightened by anyone at your home or in your life?  N   Basic Needs   Do you have any basic needs within your home that are not being met? (such as Food, Shelter, Games developer, Tranportation, paying for bills and/or medications) N   Advanced Directives   Do you have any advanced directives? No Advance

## 2022-09-12 ENCOUNTER — Other Ambulatory Visit (INDEPENDENT_AMBULATORY_CARE_PROVIDER_SITE_OTHER): Payer: Self-pay | Admitting: Family Medicine

## 2022-09-19 ENCOUNTER — Other Ambulatory Visit (INDEPENDENT_AMBULATORY_CARE_PROVIDER_SITE_OTHER): Payer: Self-pay | Admitting: Family Medicine

## 2022-09-26 ENCOUNTER — Other Ambulatory Visit: Payer: Self-pay

## 2022-09-26 ENCOUNTER — Ambulatory Visit (INDEPENDENT_AMBULATORY_CARE_PROVIDER_SITE_OTHER): Payer: Medicaid Other

## 2022-09-26 ENCOUNTER — Ambulatory Visit: Payer: Medicaid Other | Attending: Family Medicine | Admitting: Family Medicine

## 2022-09-26 DIAGNOSIS — I10 Essential (primary) hypertension: Secondary | ICD-10-CM

## 2022-09-26 LAB — CBC WITH DIFF
BASOPHIL #: 0 10*3/uL (ref 0.00–0.10)
BASOPHIL %: 1 % (ref 0–1)
EOSINOPHIL #: 0.1 10*3/uL (ref 0.00–0.50)
EOSINOPHIL %: 3 %
HCT: 39.4 % (ref 31.2–41.9)
HGB: 13.6 g/dL (ref 10.9–14.3)
LYMPHOCYTE #: 1.1 10*3/uL (ref 1.00–3.00)
LYMPHOCYTE %: 22 % (ref 16–44)
MCH: 31 pg (ref 24.7–32.8)
MCHC: 34.5 g/dL (ref 32.3–35.6)
MCV: 89.8 fL (ref 75.5–95.3)
MONOCYTE #: 0.4 10*3/uL (ref 0.30–1.00)
MONOCYTE %: 8 % (ref 5–13)
MPV: 7.3 fL — ABNORMAL LOW (ref 7.9–10.8)
NEUTROPHIL #: 3.4 10*3/uL (ref 1.85–7.80)
NEUTROPHIL %: 67 % (ref 43–77)
PLATELETS: 249 10*3/uL (ref 140–440)
RBC: 4.39 10*6/uL (ref 3.63–4.92)
RDW: 13.3 % (ref 12.3–17.7)
WBC: 5.1 10*3/uL (ref 3.8–11.8)

## 2022-09-26 LAB — HEPATIC FUNCTION PANEL
ALBUMIN/GLOBULIN RATIO: 1.5 — ABNORMAL HIGH (ref 0.8–1.4)
ALBUMIN: 4.7 g/dL (ref 3.5–5.7)
ALKALINE PHOSPHATASE: 88 U/L (ref 34–104)
ALT (SGPT): 15 U/L (ref 7–52)
AST (SGOT): 16 U/L (ref 13–39)
BILIRUBIN DIRECT: 0.09 md/dL (ref <=0.20)
BILIRUBIN TOTAL: 0.8 mg/dL (ref 0.3–1.2)
BILIRUBIN, INDIRECT: 0.71 mg/dL (ref <=1)
GLOBULIN: 3.1 (ref 2.9–5.4)
PROTEIN TOTAL: 7.8 g/dL (ref 6.4–8.9)

## 2022-09-26 LAB — LIPID PANEL
CHOL/HDL RATIO: 2.7
CHOLESTEROL: 157 mg/dL (ref <200)
HDL CHOL: 58 mg/dL (ref 23–92)
LDL CALC: 82 mg/dL (ref 0–100)
TRIGLYCERIDES: 83 mg/dL (ref <=150)
VLDL CALC: 17 mg/dL (ref 0–50)

## 2022-09-26 LAB — BASIC METABOLIC PANEL
ANION GAP: 9 mmol/L (ref 4–13)
BUN/CREA RATIO: 21 (ref 6–22)
BUN: 19 mg/dL (ref 7–25)
CALCIUM: 10.2 mg/dL (ref 8.6–10.3)
CHLORIDE: 102 mmol/L (ref 98–107)
CO2 TOTAL: 30 mmol/L (ref 21–31)
CREATININE: 0.9 mg/dL (ref 0.60–1.30)
ESTIMATED GFR: 76 mL/min/{1.73_m2} (ref >59)
GLUCOSE: 112 mg/dL — ABNORMAL HIGH (ref 74–109)
OSMOLALITY, CALCULATED: 284 mOsm/kg (ref 270–290)
POTASSIUM: 3.7 mmol/L (ref 3.5–5.1)
SODIUM: 141 mmol/L (ref 136–145)

## 2022-09-26 LAB — URINALYSIS, MACROSCOPIC
BILIRUBIN: NEGATIVE mg/dL
BLOOD: NEGATIVE mg/dL
GLUCOSE: NEGATIVE mg/dL
KETONES: NEGATIVE mg/dL
LEUKOCYTES: NEGATIVE WBCs/uL
NITRITE: NEGATIVE
PH: 5 (ref 5.0–9.0)
PROTEIN: NEGATIVE mg/dL
SPECIFIC GRAVITY: 1.023 (ref 1.002–1.030)
UROBILINOGEN: NORMAL mg/dL

## 2022-09-26 LAB — URINALYSIS, MICROSCOPIC
RBCS: 1 /hpf (ref <4)
SQUAMOUS EPITHELIAL: <1 /hpf (ref <28)
WBCS: <1 /hpf (ref <6)

## 2022-09-26 LAB — THYROID STIMULATING HORMONE (SENSITIVE TSH): TSH: 2.353 u[IU]/mL (ref 0.450–5.330)

## 2022-09-29 ENCOUNTER — Encounter (INDEPENDENT_AMBULATORY_CARE_PROVIDER_SITE_OTHER): Payer: Self-pay | Admitting: Family Medicine

## 2022-09-29 ENCOUNTER — Ambulatory Visit (INDEPENDENT_AMBULATORY_CARE_PROVIDER_SITE_OTHER): Payer: Medicaid Other | Admitting: Family Medicine

## 2022-09-29 ENCOUNTER — Other Ambulatory Visit: Payer: Self-pay

## 2022-09-29 VITALS — BP 129/90 | HR 76 | Temp 98.0°F | Resp 20 | Ht 67.0 in | Wt 237.0 lb

## 2022-09-29 DIAGNOSIS — E782 Mixed hyperlipidemia: Secondary | ICD-10-CM

## 2022-09-29 DIAGNOSIS — G8929 Other chronic pain: Secondary | ICD-10-CM | POA: Insufficient documentation

## 2022-09-29 DIAGNOSIS — G5601 Carpal tunnel syndrome, right upper limb: Secondary | ICD-10-CM

## 2022-09-29 DIAGNOSIS — E876 Hypokalemia: Secondary | ICD-10-CM

## 2022-09-29 DIAGNOSIS — Z1159 Encounter for screening for other viral diseases: Secondary | ICD-10-CM | POA: Insufficient documentation

## 2022-09-29 DIAGNOSIS — M25551 Pain in right hip: Secondary | ICD-10-CM

## 2022-09-29 DIAGNOSIS — I10 Essential (primary) hypertension: Secondary | ICD-10-CM

## 2022-09-29 DIAGNOSIS — L819 Disorder of pigmentation, unspecified: Secondary | ICD-10-CM | POA: Insufficient documentation

## 2022-09-29 DIAGNOSIS — K219 Gastro-esophageal reflux disease without esophagitis: Secondary | ICD-10-CM

## 2022-09-29 MED ORDER — LEVOTHYROXINE 75 MCG TABLET
75.00 ug | ORAL_TABLET | Freq: Every morning | ORAL | 1 refills | Status: AC
Start: 2022-09-29 — End: 2023-03-28

## 2022-09-29 MED ORDER — ROSUVASTATIN 10 MG TABLET
10.0000 mg | ORAL_TABLET | Freq: Every day | ORAL | 1 refills | Status: DC
Start: 2022-09-29 — End: 2023-04-05

## 2022-09-29 MED ORDER — FLUTICASONE PROPIONATE 50 MCG/ACTUATION NASAL SPRAY,SUSPENSION
2.0000 | Freq: Every day | NASAL | 1 refills | Status: DC
Start: 2022-09-29 — End: 2023-02-13

## 2022-09-29 NOTE — Nursing Note (Signed)
09/29/22 1621   Recent Weight Change   Have you had a recent unexplained weight loss or gain? N   Domestic Violence   Because we are aware of abuse and domestic violence today, we ask all patients: Are you being hurt, hit, or frightened by anyone at your home or in your life?  N   Basic Needs   Do you have any basic needs within your home that are not being met? (such as Food, Shelter, Games developer, Tranportation, paying for bills and/or medications) N

## 2022-09-29 NOTE — Nursing Note (Signed)
09/29/22 1622   Depression Screen   Little interest or pleasure in doing things. 0   Feeling down, depressed, or hopeless 0   PHQ 2 Total 0

## 2022-09-29 NOTE — Progress Notes (Signed)
FAMILY MEDICINE, MEDICAL OFFICE BUILDING  [address]  [place] [address]       Name: Dominique Carrillo MRN:  [id number]   Date: 09/29/2022 Age: 57 y.o.          Provider: Elliot Gault, DO    Reason for visit: Follow Up 4 Months      History of Present Illness:  09/29/2022:  This 57 year old female returns for 4 month follow-up having lost 4 lb since she was here last since she has been going to the gym.  She would labs on the 25th of March.  She complains of pain in the joint of her right thigh and hip at night can not get comfortable.  Only happens when she is sleeping.  She also has a pigmented skin lesion on her left forearm change in texture it was brown now it is darker like a freckle now.  She needs a refill on her levothyroxine Crestor and Flonase.  We will schedule for her to have the skin lesion removed.  I reviewed her labs which revealed she has not anemic with a hemoglobin of 13.6 and hematocrit 39.4.  Glucose was slightly elevated at 112 BUN 19 creatinine 0.90 calcium was 10.2 lipids were normal TSH was 2.353 liver enzymes are all normal previous hemoglobin A1c was 6 and normal urinalysis normal with no protein.  Historical Data    Past Medical History:  Past Medical History:   Diagnosis Date    DVT (deep venous thrombosis) (CMS HCC)     Left Calf    Tendonitis of wrist, right     Tinnitus of both ears          Past Surgical History:  Past Surgical History:   Procedure Laterality Date    COLONOSCOPY  08/27/2018    Dr. Georgiann Cocker    ESOPHAGOGASTRODUODENOSCOPY  08/27/2018    Dr. Tedra Coupe DILATION AND CURETTAGE      URETHRAL DILATION      when 57 years old         Allergies:  Allergies   Allergen Reactions    Amoxicillin  Other Adverse Reaction (Add comment)     Mouth Blisters/Swelling    Augmentin [Amoxicillin-Pot Clavulanate]      Blisters in mouth    Propoxyphene Nausea/ Vomiting     Medications:  Current Outpatient Medications   Medication Sig    aspirin 81 mg Oral Tablet, Chewable Chew 1  Tablet (81 mg total) Once a day    CALCIUM 26-VIT D3-MAGNESIUM 15 ORAL Take 2 Tablets by mouth Once a day 2 gummies daily    cetirizine (ZYRTEC) 10 mg Oral Tablet Take 1 Tablet (10 mg total) by mouth Once a day    ergocalciferol, vitamin D2, (DRISDOL) 1,250 mcg (50,000 unit) Oral Capsule TAKE 1 CAPSULE BY MOUTH ONCE WEEKLY    famotidine (PEPCID) 40 mg Oral Tablet TAKE ONE TABLET BY MOUTH EVERY EVENING    fluticasone propionate (FLONASE) 50 mcg/actuation Nasal Spray, Suspension Administer 2 Sprays into each nostril Once a day for 180 days    hydrOXYzine pamoate (VISTARIL) 50 mg Oral Capsule Take 1 Capsule (50 mg total) by mouth Three times a day as needed    Ibuprofen (MOTRIN) 800 mg Oral Tablet Take 1 Tablet (800 mg total) by mouth Three times a day as needed for Pain    levothyroxine (SYNTHROID) 75 mcg Oral Tablet Take 1 Tablet (75 mcg total) by mouth Every morning for 180 days  pantoprazole (PROTONIX) 40 mg Oral Tablet, Delayed Release (E.C.) Take 1 Tablet (40 mg total) by mouth Once a day    rosuvastatin (CRESTOR) 10 mg Oral Tablet Take 1 Tablet (10 mg total) by mouth Once a day for 180 days    spironolacton-hydrochlorothiaz (ALDACTAZIDE) 25-25 mg Oral Tablet Take 1 Tablet by mouth Every other day for 270 days     Family History:  Family Medical History:       Problem Relation (Age of Onset)    Elevated Lipids Mother, Father, Sister    Heart Attack Mother    Hypertension (High Blood Pressure) Mother, Father, Sister    Hypothyroidism Mother, Father, Sister            Social History:  Social History     Socioeconomic History    Marital status: Single   Tobacco Use    Smoking status: Former     Types: Cigarettes    Smokeless tobacco: Never   Vaping Use    Vaping status: Never Used   Substance and Sexual Activity    Alcohol use: Yes     Comment: Holiday/special occasion.    Drug use: Never           Review of Systems:  Any pertinent Review of Systems as addressed in the HPI above.    Physical Exam:  Vital  Signs:  Vitals:    09/29/22 1626   BP: (!) 129/90   Pulse: 76   Resp: 20   Temp: 36.7 C (98 F)   TempSrc: Temporal   SpO2: 98%   Weight: 108 kg (237 lb)   Height: 1.702 m (5\' 7" )   BMI: 37.2     Physical Exam  Vitals and nursing note reviewed.   Constitutional:       General: She is awake.      Appearance: Normal appearance. She is well-developed and well-groomed. She is obese.   HENT:      Head: Normocephalic and atraumatic.      Right Ear: Tympanic membrane, ear canal and external ear normal.      Left Ear: Tympanic membrane, ear canal and external ear normal.      Nose: Nose normal.      Mouth/Throat:      Mouth: Mucous membranes are moist.      Pharynx: Oropharynx is clear.   Eyes:      Extraocular Movements: Extraocular movements intact.      Conjunctiva/sclera: Conjunctivae normal.      Pupils: Pupils are equal, round, and reactive to light.   Cardiovascular:      Rate and Rhythm: Normal rate and regular rhythm.      Heart sounds: Normal heart sounds, S1 normal and S2 normal.   Pulmonary:      Effort: Pulmonary effort is normal.      Breath sounds: Normal breath sounds.   Abdominal:      General: Abdomen is protuberant. Bowel sounds are normal.      Palpations: Abdomen is soft.   Musculoskeletal:         General: Normal range of motion.      Cervical back: Normal range of motion and neck supple.   Skin:     General: Skin is warm and dry.      Capillary Refill: Capillary refill takes less than 2 seconds.      Findings: Lesion present.      Comments: Pigmented skin lesion left forearm 1 cm growing irregular bordered change  in color.   Neurological:      General: No focal deficit present.      Mental Status: She is alert and oriented to person, place, and time. Mental status is at baseline.      Cranial Nerves: Cranial nerves 2-12 are intact.      Sensory: Sensation is intact.      Motor: Motor function is intact.      Coordination: Coordination is intact.      Gait: Gait is intact.   Psychiatric:         Mood  and Affect: Mood normal.         Behavior: Behavior normal. Behavior is cooperative.         Thought Content: Thought content normal.         Judgment: Judgment normal.       Assessment:    ICD-10-CM    1. Essential hypertension  I10 LIPID PANEL     HEPATIC FUNCTION PANEL     CBC/DIFF     URINALYSIS, MACROSCOPIC AND MICROSCOPIC W/CULTURE REFLEX     BASIC METABOLIC PANEL     THYROID STIMULATING HORMONE (SENSITIVE TSH)      2. Need for hepatitis C screening test  Z11.59 HIV 1 AND 2 RAPID SCREEN     HEPATITIS C ANTIBODY SCREEN WITH REFLEX TO HCV PCR      3. Mixed hyperlipidemia  E78.2       4. Primary hypertension  I10       5. Gastroesophageal reflux disease without esophagitis  K21.9       6. Hypokalemia  E87.6       7. 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 Chronic right hip pain  M25.551 XR LOW PELVIS W RIGHT LATERAL HIP    G89.29       9. Pigmented skin lesion of uncertain behavior of upper extremity  L81.9          Plan:  Orders Placed This Encounter    XR LOW PELVIS W RIGHT LATERAL HIP    HIV 1 AND 2 RAPID SCREEN    HEPATITIS C ANTIBODY SCREEN WITH REFLEX TO HCV PCR    LIPID PANEL    HEPATIC FUNCTION PANEL    CBC/DIFF    URINALYSIS, MACROSCOPIC AND MICROSCOPIC W/CULTURE REFLEX    BASIC METABOLIC PANEL    THYROID STIMULATING HORMONE (SENSITIVE TSH)    fluticasone propionate (FLONASE) 50 mcg/actuation Nasal Spray, Suspension    levothyroxine (SYNTHROID) 75 mcg Oral Tablet    rosuvastatin (CRESTOR) 10 mg Oral Tablet     Patient's blood pressure was slightly elevated today so she is going to keep a record of her blood pressures at home.  Today I refilled her Flonase levothyroxine and rosuvastatin.  Her liver enzymes were normal.  Today I ordered HIV 1 and 2 and hepatitis-C screening test.  For 4 months from now I recommend she have a lipid panel hepatic function panel CBC with diff urinalysis basic metabolic panel thyroid-stimulating hormone.  She should stay on a low-fat low-cholesterol heart  healthy diet avoid high fructose corn syrup and concentrated sweets.  She should limit salt to no more than 2 g a day.  She should exercise 5 days a week for 30 minutes this can be riding stationary bicycle rowing machine elliptical or treadmill.  She should limit caloric intake to no more than 1800 calories today.  More than 50% of the visit  was spent counseling and coordinating patient care.  All questions were answered to the satisfaction of the patient.    Return in about 6 months (around 04/01/2023).    Elliot Gault, DO     Portions of this note may be dictated using voice recognition software or a dictation service. Variances in spelling and vocabulary are possible and unintentional. Not all errors are caught/corrected. Please notify the [facility] if any discrepancies are noted or if the meaning of any statement is not clear.

## 2022-10-04 ENCOUNTER — Other Ambulatory Visit (INDEPENDENT_AMBULATORY_CARE_PROVIDER_SITE_OTHER): Payer: Self-pay

## 2022-10-14 ENCOUNTER — Other Ambulatory Visit: Payer: Self-pay

## 2022-10-14 ENCOUNTER — Ambulatory Visit
Admission: RE | Admit: 2022-10-14 | Discharge: 2022-10-14 | Disposition: A | Discharge status: Home / Self Care | Payer: Medicaid Other | Source: Ambulatory Visit | Attending: Family Medicine | Admitting: Family Medicine

## 2022-10-14 ENCOUNTER — Other Ambulatory Visit (INDEPENDENT_AMBULATORY_CARE_PROVIDER_SITE_OTHER): Payer: Self-pay | Admitting: Family Medicine

## 2022-10-14 DIAGNOSIS — L819 Disorder of pigmentation, unspecified: Secondary | ICD-10-CM

## 2022-10-14 DIAGNOSIS — G8929 Other chronic pain: Secondary | ICD-10-CM | POA: Insufficient documentation

## 2022-10-14 DIAGNOSIS — G5601 Carpal tunnel syndrome, right upper limb: Secondary | ICD-10-CM

## 2022-10-14 DIAGNOSIS — E876 Hypokalemia: Secondary | ICD-10-CM

## 2022-10-14 DIAGNOSIS — E782 Mixed hyperlipidemia: Secondary | ICD-10-CM

## 2022-10-14 DIAGNOSIS — K219 Gastro-esophageal reflux disease without esophagitis: Secondary | ICD-10-CM

## 2022-10-14 DIAGNOSIS — I10 Essential (primary) hypertension: Secondary | ICD-10-CM

## 2022-10-14 DIAGNOSIS — Z1159 Encounter for screening for other viral diseases: Secondary | ICD-10-CM

## 2022-10-14 DIAGNOSIS — M25551 Pain in right hip: Secondary | ICD-10-CM | POA: Insufficient documentation

## 2022-11-16 ENCOUNTER — Ambulatory Visit (INDEPENDENT_AMBULATORY_CARE_PROVIDER_SITE_OTHER): Payer: Medicaid Other | Admitting: Family Medicine

## 2022-11-16 ENCOUNTER — Other Ambulatory Visit: Payer: Self-pay

## 2022-11-16 VITALS — BP 121/79 | HR 62 | Temp 96.9°F | Resp 19 | Ht 67.0 in | Wt 242.0 lb

## 2022-11-16 DIAGNOSIS — L819 Disorder of pigmentation, unspecified: Secondary | ICD-10-CM

## 2022-11-16 DIAGNOSIS — N9089 Other specified noninflammatory disorders of vulva and perineum: Secondary | ICD-10-CM

## 2022-11-16 DIAGNOSIS — L821 Other seborrheic keratosis: Secondary | ICD-10-CM

## 2022-11-16 NOTE — Progress Notes (Signed)
FAMILY MEDICINE, MEDICAL OFFICE BUILDING  [address]  [place] [address]       Name: Dominique Carrillo MRN:  [id number]   Date: 11/16/2022 Age: 57 y.o.          Provider: Mickey Farber, DO    Reason for visit: Skin  Lesion      History of Present Illness:  11/16/2022:  This 57 year old female has a crusty bleeding irritated skin lesion the inside of her left arm and is requesting removal of the same today.  Historical Data    Past Medical History:  Past Medical History:   Diagnosis Date    DVT (deep venous thrombosis) (CMS HCC)     Left Calf    Tendonitis of wrist, right     Tinnitus of both ears          Past Surgical History:  Past Surgical History:   Procedure Laterality Date    COLONOSCOPY  08/27/2018    Dr. Abbe Amsterdam    ESOPHAGOGASTRODUODENOSCOPY  08/27/2018    Dr. Arley Phenix DILATION AND CURETTAGE      URETHRAL DILATION      when 57 years old         Allergies:  Allergies   Allergen Reactions    Amoxicillin  Other Adverse Reaction (Add comment)     Mouth Blisters/Swelling    Augmentin [Amoxicillin-Pot Clavulanate]      Blisters in mouth    Propoxyphene Nausea/ Vomiting     Medications:  Current Outpatient Medications   Medication Sig    aspirin 81 mg Oral Tablet, Chewable Chew 1 Tablet (81 mg total) Once a day    CALCIUM 26-VIT D3-MAGNESIUM 15 ORAL Take 2 Tablets by mouth Once a day 2 gummies daily    cetirizine (ZYRTEC) 10 mg Oral Tablet Take 1 Tablet (10 mg total) by mouth Once a day    ergocalciferol, vitamin D2, (DRISDOL) 1,250 mcg (50,000 unit) Oral Capsule TAKE 1 CAPSULE BY MOUTH ONCE WEEKLY    famotidine (PEPCID) 40 mg Oral Tablet TAKE ONE TABLET BY MOUTH EVERY EVENING    fluticasone propionate (FLONASE) 50 mcg/actuation Nasal Spray, Suspension Administer 2 Sprays into each nostril Once a day for 180 days    hydrOXYzine pamoate (VISTARIL) 50 mg Oral Capsule Take 1 Capsule (50 mg total) by mouth Three times a day as needed    Ibuprofen (MOTRIN) 800 mg Oral Tablet Take 1 Tablet (800 mg total) by  mouth Three times a day as needed for Pain    levothyroxine (SYNTHROID) 75 mcg Oral Tablet Take 1 Tablet (75 mcg total) by mouth Every morning for 180 days    pantoprazole (PROTONIX) 40 mg Oral Tablet, Delayed Release (E.C.) Take 1 Tablet (40 mg total) by mouth Once a day    rosuvastatin (CRESTOR) 10 mg Oral Tablet Take 1 Tablet (10 mg total) by mouth Once a day for 180 days    spironolacton-hydrochlorothiaz (ALDACTAZIDE) 25-25 mg Oral Tablet Take 1 Tablet by mouth Every other day for 270 days     Family History:  Family Medical History:       Problem Relation (Age of Onset)    Elevated Lipids Mother, Father, Sister    Heart Attack Mother    Hypertension (High Blood Pressure) Mother, Father, Sister    Hypothyroidism Mother, Father, Sister            Social History:  Social History     Socioeconomic History    Marital  status: Single   Tobacco Use    Smoking status: Former     Types: Cigarettes    Smokeless tobacco: Never   Vaping Use    Vaping status: Never Used   Substance and Sexual Activity    Alcohol use: Yes     Comment: Holiday/special occasion.    Drug use: Never           Review of Systems:  Any pertinent Review of Systems as addressed in the HPI above.    Physical Exam:  Vital Signs:  Vitals:    11/16/22 1517   BP: 121/79   Pulse: 62   Resp: 19   Temp: 36.1 C (96.9 F)   TempSrc: Temporal   SpO2: 98%   Weight: 110 kg (242 lb)   Height: 1.702 m (5\' 7" )   BMI: 37.98     Physical Exam  Vitals and nursing note reviewed.   Constitutional:       General: She is awake.      Appearance: Normal appearance. She is well-developed and well-groomed. She is obese.   HENT:      Head: Normocephalic and atraumatic.      Right Ear: Tympanic membrane, ear canal and external ear normal.      Left Ear: Tympanic membrane, ear canal and external ear normal.      Nose: Nose normal.      Mouth/Throat:      Mouth: Mucous membranes are moist.      Pharynx: Oropharynx is clear.   Eyes:      Extraocular Movements: Extraocular movements  intact.      Conjunctiva/sclera: Conjunctivae normal.      Pupils: Pupils are equal, round, and reactive to light.   Cardiovascular:      Rate and Rhythm: Normal rate and regular rhythm.   Pulmonary:      Effort: Pulmonary effort is normal.      Breath sounds: Normal breath sounds.   Abdominal:      General: Abdomen is flat. Bowel sounds are normal.      Palpations: Abdomen is soft.   Musculoskeletal:         General: Normal range of motion.      Cervical back: Normal range of motion and neck supple.   Skin:     General: Skin is warm and dry.      Capillary Refill: Capillary refill takes less than 2 seconds.      Findings: Lesion present.             Comments: Crusty bleeding irritated skin lesion 1 cm x 1 cm lift in her arm.   Neurological:      General: No focal deficit present.      Mental Status: She is alert and oriented to person, place, and time. Mental status is at baseline.   Psychiatric:         Mood and Affect: Mood normal.         Behavior: Behavior normal. Behavior is cooperative.         Thought Content: Thought content normal.         Judgment: Judgment normal.     She is in the room ask her  Assessment:  Irritated seborrheic keratosis  Plan:  Area was prepped and draped in the usual manner and was cleaned with alcohol and then Betadine and allowed to dry.  1% lidocaine with epinephrine was instilled to the area approximately 1.5 cc using a 15 blade  the lesion was excised from the skin and sent to pathology.  Cautery was used with silver nitrate to stop the bleeding and a Band-Aid was applied.  Patient tolerated procedure well with minimal bleeding noted.  We will see her back in 1 week to follow up on lesion instructions on care of the wound was given in both oral and written form.      Return in about 1 week (around 11/23/2022).    Mickey Farber, DO     Portions of this note may be dictated using voice recognition software or a dictation service. Variances in spelling and vocabulary are possible and  unintentional. Not all errors are caught/corrected. Please notify the Thereasa Parkin if any discrepancies are noted or if the meaning of any statement is not clear.

## 2022-11-17 ENCOUNTER — Other Ambulatory Visit (INDEPENDENT_AMBULATORY_CARE_PROVIDER_SITE_OTHER): Payer: Self-pay | Admitting: Family Medicine

## 2022-11-18 DIAGNOSIS — L821 Other seborrheic keratosis: Secondary | ICD-10-CM

## 2022-11-18 LAB — SURGICAL PATHOLOGY SPECIMEN

## 2022-12-03 ENCOUNTER — Other Ambulatory Visit (INDEPENDENT_AMBULATORY_CARE_PROVIDER_SITE_OTHER): Payer: Self-pay | Admitting: Family Medicine

## 2022-12-04 ENCOUNTER — Other Ambulatory Visit (INDEPENDENT_AMBULATORY_CARE_PROVIDER_SITE_OTHER): Payer: Self-pay | Admitting: Family Medicine

## 2022-12-05 ENCOUNTER — Other Ambulatory Visit (INDEPENDENT_AMBULATORY_CARE_PROVIDER_SITE_OTHER): Payer: Self-pay | Admitting: Family Medicine

## 2022-12-09 ENCOUNTER — Other Ambulatory Visit (INDEPENDENT_AMBULATORY_CARE_PROVIDER_SITE_OTHER): Payer: Self-pay | Admitting: Family Medicine

## 2022-12-15 ENCOUNTER — Other Ambulatory Visit (INDEPENDENT_AMBULATORY_CARE_PROVIDER_SITE_OTHER): Payer: Self-pay | Admitting: Family Medicine

## 2023-01-19 ENCOUNTER — Other Ambulatory Visit (INDEPENDENT_AMBULATORY_CARE_PROVIDER_SITE_OTHER): Payer: Self-pay | Admitting: Family Medicine

## 2023-01-19 DIAGNOSIS — Z1231 Encounter for screening mammogram for malignant neoplasm of breast: Secondary | ICD-10-CM

## 2023-01-27 ENCOUNTER — Encounter (HOSPITAL_COMMUNITY): Payer: Self-pay

## 2023-01-27 ENCOUNTER — Ambulatory Visit
Admission: RE | Admit: 2023-01-27 | Discharge: 2023-01-27 | Disposition: A | Discharge status: Home / Self Care | Payer: Medicaid Other | Source: Ambulatory Visit | Attending: Family Medicine | Admitting: Family Medicine

## 2023-01-27 ENCOUNTER — Other Ambulatory Visit: Payer: Self-pay

## 2023-01-27 DIAGNOSIS — Z1231 Encounter for screening mammogram for malignant neoplasm of breast: Secondary | ICD-10-CM | POA: Insufficient documentation

## 2023-01-30 ENCOUNTER — Encounter (INDEPENDENT_AMBULATORY_CARE_PROVIDER_SITE_OTHER): Payer: Self-pay | Admitting: Family Medicine

## 2023-02-07 ENCOUNTER — Other Ambulatory Visit (INDEPENDENT_AMBULATORY_CARE_PROVIDER_SITE_OTHER): Payer: Self-pay | Admitting: Family Medicine

## 2023-02-09 ENCOUNTER — Other Ambulatory Visit: Payer: Self-pay

## 2023-02-09 ENCOUNTER — Emergency Department (HOSPITAL_COMMUNITY): Payer: Medicaid Other

## 2023-02-09 ENCOUNTER — Emergency Department
Admission: EM | Admit: 2023-02-09 | Discharge: 2023-02-09 | Disposition: A | Discharge status: Home / Self Care | Payer: Medicaid Other | Attending: Family Medicine | Admitting: Family Medicine

## 2023-02-09 ENCOUNTER — Encounter (HOSPITAL_COMMUNITY): Payer: Self-pay | Admitting: Family Medicine

## 2023-02-09 DIAGNOSIS — M25562 Pain in left knee: Secondary | ICD-10-CM | POA: Insufficient documentation

## 2023-02-09 DIAGNOSIS — M549 Dorsalgia, unspecified: Secondary | ICD-10-CM

## 2023-02-09 DIAGNOSIS — K219 Gastro-esophageal reflux disease without esophagitis: Secondary | ICD-10-CM | POA: Insufficient documentation

## 2023-02-09 DIAGNOSIS — M7989 Other specified soft tissue disorders: Secondary | ICD-10-CM | POA: Insufficient documentation

## 2023-02-09 DIAGNOSIS — M25552 Pain in left hip: Secondary | ICD-10-CM | POA: Insufficient documentation

## 2023-02-09 DIAGNOSIS — W19XXXA Unspecified fall, initial encounter: Secondary | ICD-10-CM

## 2023-02-09 DIAGNOSIS — W109XXA Fall (on) (from) unspecified stairs and steps, initial encounter: Secondary | ICD-10-CM | POA: Insufficient documentation

## 2023-02-09 MED ORDER — LIDOCAINE HCL 2 % MUCOSAL SOLUTION
Status: AC
Start: 2023-02-09 — End: 2023-02-09
  Filled 2023-02-09: qty 15

## 2023-02-09 MED ORDER — ALUMINUM-MAG HYDROXIDE-SIMETHICONE 200 MG-200 MG-20 MG/5 ML ORAL SUSP
ORAL | Status: AC
Start: 2023-02-09 — End: 2023-02-09
  Filled 2023-02-09: qty 30

## 2023-02-09 MED ORDER — ALUMINUM-MAG HYDROXIDE-SIMETHICONE 200 MG-200 MG-20 MG/5 ML ORAL SUSP
30.0000 mL | Freq: Once | ORAL | Status: AC
Start: 2023-02-09 — End: 2023-02-09
  Administered 2023-02-09: 30 mL via ORAL

## 2023-02-09 MED ORDER — ACETAMINOPHEN 325 MG TABLET
ORAL_TABLET | ORAL | Status: AC
Start: 2023-02-09 — End: 2023-02-09
  Filled 2023-02-09: qty 2

## 2023-02-09 MED ORDER — LIDOCAINE HCL 2 % MUCOSAL SOLUTION
15.0000 mL | Freq: Once | Status: AC
Start: 2023-02-09 — End: 2023-02-09
  Administered 2023-02-09: 15 mL via ORAL

## 2023-02-09 MED ORDER — METHYLPREDNISOLONE SOD SUCCINATE 40 MG/ML SOLUTION FOR INJ. WRAPPER
40.0000 mg | INTRAMUSCULAR | Status: AC
Start: 2023-02-09 — End: 2023-02-09
  Administered 2023-02-09: 40 mg via INTRAMUSCULAR

## 2023-02-09 MED ORDER — HYOSCYAMINE 0.125 MG/5 ML ORAL ELIXIR
ORAL_SOLUTION | ORAL | Status: AC
Start: 2023-02-09 — End: 2023-02-09
  Filled 2023-02-09: qty 10

## 2023-02-09 MED ORDER — METHYLPREDNISOLONE SOD SUCCINATE 40 MG/ML SOLUTION FOR INJ. WRAPPER
INTRAMUSCULAR | Status: AC
Start: 2023-02-09 — End: 2023-02-09
  Filled 2023-02-09: qty 1

## 2023-02-09 MED ORDER — ACETAMINOPHEN 325 MG TABLET
650.0000 mg | ORAL_TABLET | ORAL | Status: AC
Start: 2023-02-09 — End: 2023-02-09
  Administered 2023-02-09: 650 mg via ORAL

## 2023-02-09 MED ORDER — HYOSCYAMINE 0.125 MG/5 ML ORAL ELIXIR
10.0000 mL | ORAL_SOLUTION | Freq: Once | ORAL | Status: AC
Start: 2023-02-09 — End: 2023-02-09
  Administered 2023-02-09: 10 mL via ORAL

## 2023-02-09 MED ORDER — PREDNISONE 20 MG TABLET
20.0000 mg | ORAL_TABLET | Freq: Every day | ORAL | 0 refills | Status: DC
Start: 2023-02-09 — End: 2023-02-15

## 2023-02-09 NOTE — ED Provider Notes (Signed)
[facility]  ED Primary Provider Note  Patient Name: Dominique Carrillo  Patient Age: 57 y.o.  Date of Birth: 12-08-1965    Chief Complaint: Back Pain and Fall        History of Present Illness       Dominique Carrillo is a 57 y.o. female who had concerns including Back Pain and Fall.  PATIENT PRESENTED TO THE EMERGENCY DEPARTMENT WITH COMPLAINTS OF PAIN AFTER A FALL AT HOME.  PATIENT STATES THAT SHE FELL OFF OF A 3 INCH STEP, NOT A 3 FOOT STEP THAT WAS DOCUMENTED IN THE TRIAGE NOTE.  PATIENT STATES THAT SHE FELL ONTO HER LEFT KNEE.  SHE DID NOT STRIKE HER HEAD AND DID NOT LOSE CONSCIOUSNESS.  AFTER STANDING BACK UP, PATIENT COMPLAINED OF PAIN IN HER BUTTOCKS, BOTH ON THE LEFT AND THE RIGHT.  AT TIME OF EXAMINATION, PATIENT IS STANDING AND DOES NOT WANT TO SIT DOWN.  PATIENT STATES THAT SHE DID TAKE 800 MG OF IBUPROFEN PRIOR TO ARRIVAL.  SHE DENIES ANY RADIATION OF PAIN INTO THE LOWER EXTREMITIES.  SHE DENIES ANY LOSS OF SENSATION.  SHE DENIES ANY CHANGES IN BOWEL OR BLADDER HABITS OR LOSS OF BOWEL OR BLADDER CONTROL.  SHE STATES THAT SHE HAS BEEN ABLE TO URINATE AND DEFECATE SINCE THE EVENT WITHOUT DIFFICULTY.  SHE DENIES ANY PAIN IN THE BACK OR THE NECK.  PATIENT DOES COMPLAIN OF SEVERE ACID REFLUX AND STATES THAT SHE FEELS AS THOUGH IF SHE WAS NOT RECEIVE SOME MEDICATION SOON, SHE WILL HAVE LOTS OF PROBLEMS.  PATIENT DENIED ANY FURTHER COMPLAINTS AT TIME OF EXAMINATION.        Review of Systems     No other overt Review of Systems are noted to be positive except noted in the HPI.      Historical Data   History Reviewed This Encounter: Medical History  Surgical History  Family History  Social History      Physical Exam   ED Triage Vitals [02/09/23 2028]   BP (Non-Invasive) (!) 146/99   Heart Rate 96   Respiratory Rate 20   Temperature 36.4 C (97.6 F)   SpO2 97 %   Weight 97.5 kg (215 lb)   Height 1.702 m (5\' 7" )         Nursing notes reviewed for what could be assessed. Past Medical,  Surgical, and Social history reviewed for what has been completed.     Constitutional:  UNCOMFORTABLE, BUT IN NO APPARENT DISTRESS  Head: Normocephalic, atraumatic.  Mouth/Throat: no nasal discharge.  AIRWAY WAS PATENT.  Eyes: EOM grossly intact, conjunctiva normal.  Neck:  NO TENDERNESS TO PALPATION OF THE CERVICAL, THORACIC OR LUMBAR SPINE  Cardiovascular: Regular Rate and Rhythm, extremities well perfused.  Pulmonary/Chest: No respiratory distress. Lungs are symmetric to auscultation bilaterally.  Abdominal: Soft, non-tender, non-distended. Non peritoneal, no rebound, no guarding.  MSK:  TENDERNESS TO PALPATION OF THE LEFT BUTTOCK AND SACRUM WITHOUT ANY OBVIOUS DEFORMITY.  PATIENT WAS ABLE TO BEAR WEIGHT WITHOUT MUCH DIFFICULTY.  THERE WAS ALSO SOME MILD TENDERNESS TO PALPATION OVERLYING THE COCCYX.  NO SIGNIFICANT TENDERNESS TO PALPATION OR SWELLING OF THE LEFT KNEE.  NO EXTERNAL INJURY APPRECIATED.  Skin: Warm, dry, and intact  Neuro: Appropriate, CN II-XII grossly intact   Psych: Cooperative   GCS: 15          Procedures      Patient Data   Labs Ordered/Reviewed - No data to display  XR HIPS BILATERAL W PELVIS 3-4 VIEWS   Final Result by Edi, Radresults In (08/08 2148)   NEGATIVE BILATERAL HIP SERIES.                Radiologist location ID: [id number]         XR SACRUM   Final Result by Edi, Radresults In (08/08 2143)    No displaced sacral fracture.         Radiologist location ID: [id number]         XR KNEE LEFT 2 VIEW   Final Result by Edi, Radresults In (08/08 2141)   Soft tissue swelling of the left lower extremity could be contusion or dependent edema. No visualized fracture.                Radiologist location ID: [id number]             Medical Decision Making          Medical Decision Making        Studies Assessed:  IMAGING        MDM Narrative:  PATIENT PRESENTED TO THE EMERGENCY DEPARTMENT WITH COMPLAINTS OF PAIN AFTER A FALL AT HOME.  PATIENT STATES THAT SHE FELL OFF OF A 3 INCH STEP, NOT A  3 FOOT STEP THAT WAS DOCUMENTED IN THE TRIAGE NOTE.  PATIENT STATES THAT SHE FELL ONTO HER LEFT KNEE.  SHE DID NOT STRIKE HER HEAD AND DID NOT LOSE CONSCIOUSNESS.  AFTER STANDING BACK UP, PATIENT COMPLAINED OF PAIN IN HER BUTTOCKS, BOTH ON THE LEFT AND THE RIGHT.  AT TIME OF EXAMINATION, PATIENT IS STANDING AND DOES NOT WANT TO SIT DOWN.  PATIENT STATES THAT SHE DID TAKE 800 MG OF IBUPROFEN PRIOR TO ARRIVAL.  SHE DENIES ANY RADIATION OF PAIN INTO THE LOWER EXTREMITIES.  SHE DENIES ANY LOSS OF SENSATION.  SHE DENIES ANY CHANGES IN BOWEL OR BLADDER HABITS OR LOSS OF BOWEL OR BLADDER CONTROL.  SHE STATES THAT SHE HAS BEEN ABLE TO URINATE AND DEFECATE SINCE THE EVENT WITHOUT DIFFICULTY.  SHE DENIES ANY PAIN IN THE BACK OR THE NECK.  PATIENT DOES COMPLAIN OF SEVERE ACID REFLUX AND STATES THAT SHE FEELS AS THOUGH IF SHE WAS NOT RECEIVE SOME MEDICATION SOON, SHE WILL HAVE LOTS OF PROBLEMS.  PATIENT DENIED ANY FURTHER COMPLAINTS AT TIME OF EXAMINATION.  PHYSICAL EXAMINATION REVEALS TENDERNESS TO PALPATION OF THE LEFT BUTTOCK AND POSTERIOR LEFT HIP.  THERE IS ALSO SOME MILD PAIN OVER THE SACRUM.  PATIENT HAD NO SIGNIFICANT TENDERNESS TO PALPATION OF THE LEFT KNEE AND THERE WAS NO SIGNIFICANT SWELLING OR EXTERNAL INJURY APPRECIATED.  THERE WAS NO TENDERNESS TO PALPATION OF THE LUMBAR, THORACIC OR CERVICAL SPINE.Dominique Carrillo WAS ALERT AND ORIENTED X4.  SHE WAS UNCOMFORTABLE, BUT IN NO APPARENT DISTRESS.  SOLU-MEDROL AND TYLENOL WERE ORDERED.  X-RAY IMAGING WAS ORDERED.  GI COCKTAIL WAS ORDERED.  PATIENT WAS STABLE.      ED Course as of 02/09/23 2235   Thu Feb 09, 2023   2144 X-RAY IMAGING OF THE LEFT KNEE REVEALED:   FINDINGS:   No fracture.  No suspicious bone lesion.  Normal alignment.  Mild degenerative changes.  No effusion.  Soft tissue swelling.        IMPRESSION:  Soft tissue swelling of the left lower extremity could be contusion or dependent edema. No visualized fracture.   2144 X-RAY IMAGING OF THE SACRUM REVEALED:    FINDINGS:    No displaced sacral fractures within the  limits of radiography. Please note that radiographs would be limited for evaluation of nondisplaced fracture.     Disc space is normal at L5-S1. Pelvic rings are intact. Soft tissues are unremarkable.     IMPRESSION:   No displaced sacral fracture.   2156 X-RAY IMAGING IN BILATERAL HIPS AND PELVIS REVEALED:   FINDINGS:  The bony pelvic ring appears intact.  SI joints are unremarkable.     RIGHT HIP:  No fracture.  No suspicious bone lesion.  Normal alignment.  Soft tissues are unremarkable.        LEFT HIP:  No fracture.  No suspicious bone lesion.  Normal alignment.  Soft tissues are unremarkable.        IMPRESSION:  NEGATIVE BILATERAL HIP SERIES.   2206 ON REEXAMINATION, PATIENT WAS RESTING COMFORTABLY AND IN NO APPARENT DISTRESS.  PATIENT WAS MUCH MORE COMFORTABLE ON REEXAMINATION.  SHE WAS COUNSELED AND EDUCATED ON IMAGING FINDINGS.  ALL QUESTIONS WERE ANSWERED TO SATISFACTION.  PREDNISONE PRESCRIBED.  PATIENT WAS COUNSELED AND EDUCATED ON PROPER USE AND MOST COMMON SIDE EFFECTS.  PATIENT WAS COUNSELED AND EDUCATED ON SUPPORTIVE CARE AT HOME, INCLUDING OVER-THE-COUNTER MEDICATIONS, ICE.  PATIENT INSTRUCTED TO FOLLOW UP WITH PCP IN THE NEXT 1-2 DAYS TO RECHECK SYMPTOMS AND WAS GIVEN VERY CLEAR INSTRUCTIONS TO RETURN TO THE EMERGENCY DEPARTMENT FOR ANY NEW OR WORSENING SYMPTOMS.  PATIENT VERBALIZED UNDERSTANDING.  PATIENT WAS SMILING AND STABLE AT TIME OF DISCHARGE.  ALL QUESTIONS WERE ANSWERED TO SATISFACTION.  WORK EXCUSE PROVIDED.         Medications Administered in the ED   aluminum-magnesium hydroxide-simethicone (MAG-AL PLUS) 200-200-20 mg per 5 mL oral liquid (30 mL Oral Given 02/09/23 2144)     And   hyoscyamine (HYOSYNE) 0.125 mg per 5 mL oral liquid (10 mL Oral Given 02/09/23 2144)     And   lidocaine (XYLOCAINE) 2% oral topical viscous solution (15 mL Oral Given 02/09/23 2144)   methylPREDNISolone sod succ (SOLU-medrol) 40 mg/mL injection (40 mg  IntraMUSCULAR Given 02/09/23 2138)   acetaminophen (TYLENOL) tablet (650 mg Oral Given 02/09/23 2138)       Following the history, physical exam, and ED workup, the patient was deemed stable and suitable for discharge. The patient/caregiver was advised to return to the ED for any new or worsening symptoms. Discharge medications, and follow-up instructions were discussed with the patient/caregiver in detail, who verbalizes understanding. The patient/caregiver is in agreement and is comfortable with the plan of care.    Disposition: Discharged         Current Discharge Medication List        START taking these medications.        Details   predniSONE 20 mg Tablet  Commonly known as: DELTASONE   20 mg, Oral, DAILY  Qty: 5 Tablet  Refills: 0            CONTINUE these medications - NO CHANGES were made during your visit.        Details   aspirin 81 mg Tablet, Chewable   81 mg, Oral, DAILY  Refills: 0     CALCIUM 26-VIT D3-MAGNESIUM 15 ORAL   2 Tablets, Oral, DAILY, 2 gummies daily  Refills: 0     cetirizine 10 mg Tablet  Commonly known as: zyrTEC   10 mg, Oral, DAILY  Refills: 0     ergocalciferol (vitamin D2) 1,250 mcg (50,000 unit) Capsule  Commonly known as: DRISDOL   50,000 Units, Oral, EVERY 7 DAYS  Qty: 12 Capsule  Refills: 0     famotidine 40 mg Tablet  Commonly known as: PEPCID   40 mg, Oral, NIGHTLY  Qty: 90 Tablet  Refills: 0     fluticasone propionate 50 mcg/actuation Spray, Suspension  Commonly known as: FLONASE   2 Sprays, Each Nostril, DAILY  Qty: 16 g  Refills: 1     hydrOXYzine pamoate 50 mg Capsule  Commonly known as: VISTARIL   TAKE 1 CAPSULE BY MOUTH TWICE A DAY AS NEEDED  Qty: 60 Capsule  Refills: 1     Ibuprofen 800 mg Tablet  Commonly known as: MOTRIN   800 mg, Oral, 3 TIMES DAILY PRN  Refills: 0     levothyroxine 75 mcg Tablet  Commonly known as: SYNTHROID   75 mcg, Oral, EVERY MORNING  Qty: 90 Tablet  Refills: 1     pantoprazole 40 mg Tablet, Delayed Release (E.C.)  Commonly known as: PROTONIX   40  mg, Oral, DAILY  Qty: 90 Tablet  Refills: 1     rosuvastatin 10 mg Tablet  Commonly known as: CRESTOR   10 mg, Oral, DAILY  Qty: 90 Tablet  Refills: 1     spironolacton-hydrochlorothiaz 25-25 mg Tablet  Commonly known as: ALDACTAZIDE   1 Tablet, Oral, EVERY OTHER DAY  Qty: 45 Tablet  Refills: 1            Follow up:   Mickey Farber, DO  [address]  [place] [address]  [phone]    In 1 day      [facility]  [address]  [place] [address]  [phone]    As needed, If symptoms worsen               Clinical Impression   Fall (Primary)   Left hip pain   Left knee pain   Gastroesophageal reflux disease, unspecified whether esophagitis present         Discharge Medication List as of 02/09/2023 10:27 PM        START taking these medications    Details   predniSONE (DELTASONE) 20 mg Oral Tablet Take 1 Tablet (20 mg total) by mouth Once a day for 5 days, Disp-5 Tablet, R-0, E-Rx               Tawanna Sat, DO

## 2023-02-09 NOTE — ED Triage Notes (Signed)
"  I fell" at 1600 off of a 3 ft step landing on left knee and "jolted" both buttocks into lower back, denies b/b difficuty

## 2023-02-09 NOTE — ED Nurses Note (Signed)
Patient discharged at this time. Patient given a copy of discharge paperwork to review. Patient verbalizes understanding of discharge instructions and all questions answered to satisfaction. Patient out of ER at this time via ambulation.

## 2023-02-12 ENCOUNTER — Encounter (HOSPITAL_COMMUNITY): Payer: Self-pay | Admitting: Internal Medicine

## 2023-02-12 ENCOUNTER — Emergency Department (HOSPITAL_COMMUNITY): Payer: Medicaid Other

## 2023-02-12 ENCOUNTER — Observation Stay
Admission: EM | Admit: 2023-02-12 | Discharge: 2023-02-15 | Disposition: A | Discharge status: Home Health | Payer: Medicaid Other | Attending: Internal Medicine | Admitting: Internal Medicine

## 2023-02-12 ENCOUNTER — Other Ambulatory Visit: Payer: Self-pay

## 2023-02-12 DIAGNOSIS — E559 Vitamin D deficiency, unspecified: Secondary | ICD-10-CM | POA: Insufficient documentation

## 2023-02-12 DIAGNOSIS — I10 Essential (primary) hypertension: Secondary | ICD-10-CM | POA: Insufficient documentation

## 2023-02-12 DIAGNOSIS — S3210XA Unspecified fracture of sacrum, initial encounter for closed fracture: Secondary | ICD-10-CM

## 2023-02-12 DIAGNOSIS — E860 Dehydration: Secondary | ICD-10-CM | POA: Insufficient documentation

## 2023-02-12 DIAGNOSIS — K219 Gastro-esophageal reflux disease without esophagitis: Secondary | ICD-10-CM | POA: Insufficient documentation

## 2023-02-12 DIAGNOSIS — M545 Low back pain, unspecified: Secondary | ICD-10-CM | POA: Insufficient documentation

## 2023-02-12 DIAGNOSIS — Z87891 Personal history of nicotine dependence: Secondary | ICD-10-CM | POA: Insufficient documentation

## 2023-02-12 DIAGNOSIS — M810 Age-related osteoporosis without current pathological fracture: Secondary | ICD-10-CM | POA: Insufficient documentation

## 2023-02-12 DIAGNOSIS — R52 Pain, unspecified: Secondary | ICD-10-CM | POA: Diagnosis present

## 2023-02-12 DIAGNOSIS — W19XXXA Unspecified fall, initial encounter: Secondary | ICD-10-CM | POA: Insufficient documentation

## 2023-02-12 DIAGNOSIS — Z7982 Long term (current) use of aspirin: Secondary | ICD-10-CM | POA: Insufficient documentation

## 2023-02-12 DIAGNOSIS — R262 Difficulty in walking, not elsewhere classified: Secondary | ICD-10-CM | POA: Insufficient documentation

## 2023-02-12 DIAGNOSIS — E039 Hypothyroidism, unspecified: Secondary | ICD-10-CM | POA: Insufficient documentation

## 2023-02-12 DIAGNOSIS — S32119A Unspecified Zone I fracture of sacrum, initial encounter for closed fracture: Principal | ICD-10-CM | POA: Insufficient documentation

## 2023-02-12 DIAGNOSIS — Z79890 Hormone replacement therapy (postmenopausal): Secondary | ICD-10-CM | POA: Insufficient documentation

## 2023-02-12 DIAGNOSIS — Z86718 Personal history of other venous thrombosis and embolism: Secondary | ICD-10-CM | POA: Insufficient documentation

## 2023-02-12 DIAGNOSIS — E785 Hyperlipidemia, unspecified: Secondary | ICD-10-CM | POA: Insufficient documentation

## 2023-02-12 HISTORY — DX: Vitamin D deficiency, unspecified: E55.9

## 2023-02-12 HISTORY — DX: Other specified disorders of bone density and structure, unspecified site: M85.80

## 2023-02-12 HISTORY — DX: Hypothyroidism, unspecified: E03.9

## 2023-02-12 LAB — CBC WITH DIFF
BASOPHIL #: 0 10*3/uL (ref 0.00–0.10)
BASOPHIL %: 0 % (ref 0–1)
EOSINOPHIL #: 0 10*3/uL (ref 0.00–0.50)
EOSINOPHIL %: 0 % — ABNORMAL LOW (ref 1–7)
HCT: 34.9 % (ref 31.2–41.9)
HGB: 11.9 g/dL (ref 10.9–14.3)
LYMPHOCYTE #: 0.8 10*3/uL — ABNORMAL LOW (ref 1.00–3.00)
LYMPHOCYTE %: 10 % — ABNORMAL LOW (ref 16–44)
MCH: 30.6 pg (ref 24.7–32.8)
MCHC: 34 g/dL (ref 32.3–35.6)
MCV: 89.9 fL (ref 75.5–95.3)
MONOCYTE #: 0.6 10*3/uL (ref 0.30–1.00)
MONOCYTE %: 8 % (ref 5–13)
MPV: 7.4 fL — ABNORMAL LOW (ref 7.9–10.8)
NEUTROPHIL #: 6.8 10*3/uL (ref 1.85–7.80)
NEUTROPHIL %: 82 % — ABNORMAL HIGH (ref 43–77)
PLATELETS: 212 10*3/uL (ref 140–440)
RBC: 3.88 10*6/uL (ref 3.63–4.92)
RDW: 13.5 % (ref 12.3–17.7)
WBC: 8.3 10*3/uL (ref 3.8–11.8)

## 2023-02-12 LAB — COMPREHENSIVE METABOLIC PANEL, NON-FASTING
ALBUMIN/GLOBULIN RATIO: 1.5 — ABNORMAL HIGH (ref 0.8–1.4)
ALBUMIN: 4.1 g/dL (ref 3.5–5.7)
ALKALINE PHOSPHATASE: 73 U/L (ref 34–104)
ALT (SGPT): 16 U/L (ref 7–52)
ANION GAP: 9 mmol/L (ref 4–13)
AST (SGOT): 15 U/L (ref 13–39)
BILIRUBIN TOTAL: 0.7 mg/dL (ref 0.3–1.0)
BUN/CREA RATIO: 20 (ref 6–22)
BUN: 16 mg/dL (ref 7–25)
CALCIUM, CORRECTED: 8.9 mg/dL (ref 8.9–10.8)
CALCIUM: 9 mg/dL (ref 8.6–10.3)
CHLORIDE: 107 mmol/L (ref 98–107)
CO2 TOTAL: 26 mmol/L (ref 21–31)
CREATININE: 0.82 mg/dL (ref 0.60–1.30)
ESTIMATED GFR: 84 mL/min/{1.73_m2} (ref >59)
GLOBULIN: 2.8 — ABNORMAL LOW (ref 2.9–5.4)
GLUCOSE: 109 mg/dL (ref 74–109)
OSMOLALITY, CALCULATED: 285 mOsm/kg (ref 270–290)
POTASSIUM: 3.3 mmol/L — ABNORMAL LOW (ref 3.5–5.1)
PROTEIN TOTAL: 6.9 g/dL (ref 6.4–8.9)
SODIUM: 142 mmol/L (ref 136–145)

## 2023-02-12 LAB — GOLD TOP TUBE

## 2023-02-12 LAB — GRAY TOP TUBE

## 2023-02-12 LAB — BLUE TOP TUBE

## 2023-02-12 LAB — LIGHT GREEN TOP TUBE

## 2023-02-12 MED ORDER — ACETAMINOPHEN 325 MG TABLET
650.0000 mg | ORAL_TABLET | ORAL | Status: DC | PRN
Start: 2023-02-12 — End: 2023-02-15

## 2023-02-12 MED ORDER — ONDANSETRON HCL (PF) 4 MG/2 ML INJECTION SOLUTION
INTRAMUSCULAR | Status: AC
Start: 2023-02-12 — End: 2023-02-12
  Filled 2023-02-12: qty 2

## 2023-02-12 MED ORDER — SODIUM CHLORIDE 0.9 % IV BOLUS
1000.0000 mL | INJECTION | Status: AC
Start: 2023-02-12 — End: 2023-02-12
  Administered 2023-02-12: 0 mL via INTRAVENOUS
  Administered 2023-02-12: 1000 mL via INTRAVENOUS

## 2023-02-12 MED ORDER — HYDROCODONE 5 MG-ACETAMINOPHEN 325 MG TABLET
1.0000 | ORAL_TABLET | ORAL | Status: DC | PRN
Start: 2023-02-12 — End: 2023-02-15
  Administered 2023-02-12 – 2023-02-15 (×6): 1 via ORAL
  Filled 2023-02-12 (×6): qty 1

## 2023-02-12 MED ORDER — ONDANSETRON HCL (PF) 4 MG/2 ML INJECTION SOLUTION
4.0000 mg | INTRAMUSCULAR | Status: AC
Start: 2023-02-12 — End: 2023-02-12
  Administered 2023-02-12: 4 mg via INTRAVENOUS

## 2023-02-12 MED ORDER — SENNOSIDES 8.6 MG-DOCUSATE SODIUM 50 MG TABLET
1.0000 | ORAL_TABLET | Freq: Two times a day (BID) | ORAL | Status: DC | PRN
Start: 2023-02-12 — End: 2023-02-13

## 2023-02-12 MED ORDER — MORPHINE 2 MG/ML INJECTION WRAPPER
2.0000 mg | INJECTION | INTRAMUSCULAR | Status: AC
Start: 2023-02-12 — End: 2023-02-12
  Administered 2023-02-12: 2 mg via INTRAVENOUS

## 2023-02-12 MED ORDER — MORPHINE 2 MG/ML INJECTION WRAPPER
INJECTION | INTRAMUSCULAR | Status: AC
Start: 2023-02-12 — End: 2023-02-12
  Filled 2023-02-12: qty 1

## 2023-02-12 MED ORDER — ENOXAPARIN 40 MG/0.4 ML SUBCUTANEOUS SYRINGE
40.0000 mg | INJECTION | Freq: Every day | SUBCUTANEOUS | Status: DC
Start: 2023-02-12 — End: 2023-02-15
  Administered 2023-02-12 – 2023-02-15 (×4): 40 mg via SUBCUTANEOUS
  Filled 2023-02-12 (×4): qty 0.4

## 2023-02-12 MED ORDER — ONDANSETRON HCL (PF) 4 MG/2 ML INJECTION SOLUTION
4.0000 mg | Freq: Four times a day (QID) | INTRAMUSCULAR | Status: DC | PRN
Start: 2023-02-12 — End: 2023-02-15

## 2023-02-12 MED ORDER — POLYETHYLENE GLYCOL 3350 17 GRAM ORAL POWDER PACKET
17.0000 g | Freq: Every day | ORAL | Status: DC
Start: 2023-02-12 — End: 2023-02-15
  Administered 2023-02-12 – 2023-02-13 (×2): 0 g via ORAL
  Administered 2023-02-14 – 2023-02-15 (×2): 17 g via ORAL
  Filled 2023-02-12 (×4): qty 1

## 2023-02-12 NOTE — ED Triage Notes (Signed)
Fell Thursday and seen at that time. Pt reports pain has worsened since then. Pain reported to left hip/lower back, left knee.    PRS: 20g left hand, Toradol 15 mg, NS KVO, 99

## 2023-02-12 NOTE — ED Nurses Note (Signed)
Patient received to room 5 with complaints of left hip and lower back pain. Pt states she fell stepping into her bathroom. States is was about a 5 inch step. Landed on her left knee and felt a "pop" when she landed. Since then has had pain radiating from her lower back to her left leg and into her right groin. Pt states she was evaluated at the ED after the fall and nothing was found. Doctor recommended RICE protocol which pt states she has followed. Pt has had no relief from pain at this time. Upon assessment there is some light bruising below the left knee. Pt states her pain is still a 9.5 out of 10 at this time. Pt is tearful upon assessment and stated she can not live with this pain. Pt did state she has been alternating taking tylenol and ibuprofen with no relief. Patient placed on monitor, VSS, assessment and history completed. Call light in hand, encouraged to call for any needs. Awaiting providers orders at this time.

## 2023-02-12 NOTE — Care Plan (Signed)
Problem: Adult Inpatient Plan of Care  Goal: Patient-Specific Goal (Individualized)  Outcome: Ongoing (see interventions/notes)  Flowsheets (Taken 02/12/2023 1700)  Individualized Care Needs: ADL assist.  Anxieties, Fears or Concerns: Anxious about being in the hospital and recent fracture.  Patient-Specific Goals (Include Timeframe): Wants to go to rehab.    Patient continues to receive PRN and scheduled medications, pain medication as needed, IV fluids, fall prevention interventions to promote safety, TCDB and use of incentive spirometry to promote lung expansion and oxygenation, patient education provided regarding fall risk prevention, and discharge planning remains ongoing.

## 2023-02-12 NOTE — H&P (Signed)
[place] MEDICINE [facility]    HOSPITALIST H&P    Dominique Carrillo 57 y.o. female ED05/ED05   Date of Service: 02/12/2023    Date of Admission:  02/12/2023   PCP: Mickey Farber, DO Code Status:Full Code       Chief Complaint: Pain, difficulty ambulating    HPI:   Patient presents to the ER today after falling recently.  She  Was seen in the ER 3 days ago with complaints of low back and hip pain, x-rays were negative, she was discharged home.  She returns today stating that she was having great difficulty ambulating due to excruciating pain.  CT lumbar spine shows acute nondisplaced fracture to the left sacrum.  Patient does have history of low vitamin-D and osteopenia.  She was history of broken bones in the past.  She is agreeable to rehab placement at this time.  Labs are unremarkable.  ED medications:   Medications Administered in the ED   NS bolus infusion 1,000 mL (0 mL Intravenous Stopped 02/12/23 1152)   morphine 2 mg/mL injection (2 mg Intravenous Given 02/12/23 1020)   ondansetron (ZOFRAN) 2 mg/mL injection (4 mg Intravenous Given 02/12/23 1020)         PMHx:    Past Medical History:   Diagnosis Date    DVT (deep venous thrombosis) (CMS HCC)     Left Calf    Hypothyroidism     Osteopenia     Tendonitis of wrist, right     Tinnitus of both ears     Vitamin D deficiency     PSHx:   Past Surgical History:   Procedure Laterality Date    COLONOSCOPY  08/27/2018    Dr. Abbe Amsterdam    ESOPHAGOGASTRODUODENOSCOPY  08/27/2018    Dr. Arley Phenix DILATION AND CURETTAGE      URETHRAL DILATION      when 57 years old       Allergies:    Allergies   Allergen Reactions    Amoxicillin  Other Adverse Reaction (Add comment)     Mouth Blisters/Swelling    Augmentin [Amoxicillin-Pot Clavulanate]      Blisters in mouth    Propoxyphene Nausea/ Vomiting    Social History  Social History     Tobacco Use    Smoking status: Former     Types: Cigarettes    Smokeless tobacco: Never   Vaping Use    Vaping status: Never Used    Substance Use Topics    Alcohol use: Yes     Comment: Holiday/special occasion.    Drug use: Never       Family History  Family Medical History:       Problem Relation (Age of Onset)    Elevated Lipids Mother, Father, Sister    Heart Attack Mother    Hypertension (High Blood Pressure) Mother, Father, Sister    Hypothyroidism Mother, Father, Sister    No Known Problems Brother, Maternal Grandmother, Maternal Grandfather, Paternal Grandmother, Paternal Grandfather, Daughter, Son, Maternal Aunt, Maternal Uncle, Paternal Aunt, Paternal Uncle, Other               Home Meds:      Prior to Admission medications    Medication Sig Start Date End Date Taking? Authorizing Provider   aspirin 81 mg Oral Tablet, Chewable Chew 1 Tablet (81 mg total) Once a day   Yes Provider, Historical   CALCIUM 26-VIT D3-MAGNESIUM 15 ORAL Take 2 Tablets by  mouth Once a day 2 gummies daily   Yes Provider, Historical   cetirizine (ZYRTEC) 10 mg Oral Tablet Take 1 Tablet (10 mg total) by mouth Once a day   Yes Provider, Historical   ergocalciferol, vitamin D2, (DRISDOL) 1,250 mcg (50,000 unit) Oral Capsule TAKE 1 CAPSULE BY MOUTH ONCE WEEKLY 12/12/22  Yes Long, Marshall, DO   famotidine (PEPCID) 40 mg Oral Tablet TAKE ONE TABLET BY MOUTH EVERY EVENING 12/05/22  Yes Long, Marshall, DO   fluticasone propionate (FLONASE) 50 mcg/actuation Nasal Spray, Suspension Administer 2 Sprays into each nostril Once a day for 180 days 09/29/22 03/28/23 Yes Long, Marshall, DO   hydrOXYzine pamoate (VISTARIL) 50 mg Oral Capsule TAKE 1 CAPSULE BY MOUTH TWICE A DAY AS NEEDED 11/18/22  Yes Long, Gaynell Face, DO   Ibuprofen (MOTRIN) 800 mg Oral Tablet Take 1 Tablet (800 mg total) by mouth Three times a day as needed for Pain   Yes Provider, Historical   levothyroxine (SYNTHROID) 75 mcg Oral Tablet Take 1 Tablet (75 mcg total) by mouth Every morning for 180 days 09/29/22 03/28/23 Yes Long, Marshall, DO   pantoprazole (PROTONIX) 40 mg Oral Tablet, Delayed Release (E.C.) TAKE 1  TABLET BY MOUTH DAILY 12/05/22  Yes Long, Gaynell Face, DO   predniSONE (DELTASONE) 20 mg Oral Tablet Take 1 Tablet (20 mg total) by mouth Once a day for 5 days 02/09/23 02/14/23 Yes Childers, Vickki Muff, DO   rosuvastatin (CRESTOR) 10 mg Oral Tablet Take 1 Tablet (10 mg total) by mouth Once a day for 180 days 09/29/22 03/28/23 Yes Long, Marshall, DO   spironolacton-hydrochlorothiaz (ALDACTAZIDE) 25-25 mg Oral Tablet TAKE 1 TABLET BY MOUTH EVERY OTHER DAY 02/08/23  Yes Long, Gaynell Face, DO   spironolacton-hydrochlorothiaz (ALDACTAZIDE) 25-25 mg Oral Tablet Take 1 Tablet by mouth Every other day for 270 days 05/31/22 02/08/23  Long, Gaynell Face, DO          ROS:   General: No fever or chills. No weight changes, fatigue, weakness.   HEENT: No headaches, dizziness, changes in vision, changes in hearing, or difficulty swallowing.    Skin:  No rashes, erythema or bruises.   Cardiac: No chest pain, palpitations, or arrhythmia.    Respiratory: No shortness of breath, cough, or wheezing.  GI: No nausea or vomiting. No abdominal pain.   Urinary: No dysuria, hematuria, or change in frequency.    Vascular: No edema.     Musculoskeletal: No muscle weakness, pain, or decreased range of motion.   Neurologic: No loss of sensation, numbness or tingling.   Endocrine: No heat or cold intolerance or polydipsia.   Psychiatric: No insomnia, depression or anxiety.      Results for orders placed or performed during the hospital encounter of 02/12/23 (from the past 24 hour(s))   COMPREHENSIVE METABOLIC PANEL, NON-FASTING   Result Value Ref Range    SODIUM 142 136 - 145 mmol/L    POTASSIUM 3.3 (L) 3.5 - 5.1 mmol/L    CHLORIDE 107 98 - 107 mmol/L    CO2 TOTAL 26 21 - 31 mmol/L    ANION GAP 9 4 - 13 mmol/L    BUN 16 7 - 25 mg/dL    CREATININE 1.61 0.96 - 1.30 mg/dL    BUN/CREA RATIO 20 6 - 22    ESTIMATED GFR 84 >59 mL/min/1.12m^2    ALBUMIN 4.1 3.5 - 5.7 g/dL    CALCIUM 9.0 8.6 - 04.5 mg/dL    GLUCOSE 409 74 - 811 mg/dL    ALKALINE  PHOSPHATASE 73 34 - 104 U/L     ALT (SGPT) 16 7 - 52 U/L    AST (SGOT) 15 13 - 39 U/L    BILIRUBIN TOTAL 0.7 0.3 - 1.0 mg/dL    PROTEIN TOTAL 6.9 6.4 - 8.9 g/dL    ALBUMIN/GLOBULIN RATIO 1.5 (H) 0.8 - 1.4    OSMOLALITY, CALCULATED 285 270 - 290 mOsm/kg    CALCIUM, CORRECTED 8.9 8.9 - 10.8 mg/dL    GLOBULIN 2.8 (L) 2.9 - 5.4   CBC WITH DIFF   Result Value Ref Range    WBC 8.3 3.8 - 11.8 x10^3/uL    RBC 3.88 3.63 - 4.92 x10^6/uL    HGB 11.9 10.9 - 14.3 g/dL    HCT 03.4 74.2 - 59.5 %    MCV 89.9 75.5 - 95.3 fL    MCH 30.6 24.7 - 32.8 pg    MCHC 34.0 32.3 - 35.6 g/dL    RDW 63.8 75.6 - 43.3 %    PLATELETS 212 140 - 440 x10^3/uL    MPV 7.4 (L) 7.9 - 10.8 fL    NEUTROPHIL % 82 (H) 43 - 77 %    LYMPHOCYTE % 10 (L) 16 - 44 %    MONOCYTE % 8 5 - 13 %    EOSINOPHIL % 0 (L) 1 - 7 %    BASOPHIL % 0 0 - 1 %    NEUTROPHIL # 6.80 1.85 - 7.80 x10^3/uL    LYMPHOCYTE # 0.80 (L) 1.00 - 3.00 x10^3/uL    MONOCYTE # 0.60 0.30 - 1.00 x10^3/uL    EOSINOPHIL # 0.00 0.00 - 0.50 x10^3/uL    BASOPHIL # 0.00 0.00 - 0.10 x10^3/uL   BLUE TOP TUBE   Result Value Ref Range    RAINBOW/EXTRA TUBE AUTO RESULT Yes    GRAY TOP TUBE   Result Value Ref Range    RAINBOW/EXTRA TUBE AUTO RESULT Yes           Physical:  Filed Vitals:    02/12/23 1230 02/12/23 1245 02/12/23 1300 02/12/23 1315   BP: 137/79 (!) 143/91 (!) 141/89 (!) 141/70   Pulse:    65   Resp:    18   Temp:       SpO2: 95% 94% 92% 92%      General: Patient is alert and oriented to person, place, and time. No acute distress. Communicates appropriately.   Head: Normocephalic and atraumatic.    Eyes: Pupils equally round and react to light and accommodate. Extraocular movements intact.  Conjunctiva normal. Sclerae are normal.    Nose: Nasal passages clear. Mucosa moist.    Throat: Moist oral mucosa. No erythema or exudate of the pharynx. Clear oropharynx.    Neck: Supple. No cervical lymphadenopathy or supraclavicular nodes detected. Trachea midline   Heart: Regular rate and rhythm. S1 & S2 present. No S3 or S4. No rubs,  gallops, or murmurs appreciated.   Lungs: Clear to auscultation bilaterally with no wheezes or rales. Equal chest excursion.  No conversational dyspnea. No respiratory distress noted.   Abdomen: Soft, nontender, nondistended belly. Bowel sounds are present in all four quadrants. No rigidity.  No guarding.  No ascites.   Extremities: No edema, cyanosis, or clubbing. Grossly moves all extremities.    Skin: Warm and dry without lesions. No ecchymosis noted.    Neurologic: Cranial nerves II through XII are grossly intact.Strength 5/5 in upper extremities and lower extremities bilaterally.    Genitourinary:  No  urinary incontinence or Foley catheter   Psychiatric: Judgment and insight are intact. Mood and affect are appropriate for the situation.       Diagnostic studies:  No results found.       EKG interpretation:     @PEVF @    Assessment:  Active Hospital Problems   (*Primary Problem)    Diagnosis    *Sacral fracture (CMS HCC)    Severe pain    Vitamin D deficiency    Hypothyroidism    Osteoporosis       Plan:  Patient will be admitted for the above problems.      Sacral fracture  Vitamin-D deficiency  Hypothyroidism  Osteoporosis  Severe pain with ambulation    Consult PT  Case management consult for rehab placement  Pain medications as needed  We will need outpatient follow-up for osteoporosis management  Resume home medications as appropriate  A.m. labs  Vitamin-D level      The hospitalist has examined patient, and reviewed all material, and agrees with the above medical management at this time.      DVT prophylaxis: LMWH     Valentina Shaggy, [facility]    [facility] MEDICINE HOSPITALIST

## 2023-02-12 NOTE — ED Nurses Note (Signed)
Report called to [address] [place] At this time.

## 2023-02-12 NOTE — ED Provider Notes (Signed)
Emergency Medicine    Name: Dominique Carrillo  Age and Gender: 57 y.o. female  Date of Birth: May 26, 1966  MRN: [id number]  PCP: Mickey Farber, DO    CC:  Chief Complaint   Patient presents with    Back Pain       HPI:  Dominique Carrillo is a 57 y.o. White female with history of recent fall onto her knee, seen here in the ED and had negative plain films. Today notes that she continues to have pain in her left lower back/buttock and right hip.  She has intense pain with movement and ambulation. She denies bowel or bladder changes. She does believe she is dehydrated due to difficulty getting to fluids.      [place] Pain Rating Scale     On a scale of 0-10, during the past 24 hours, pain has interfered with you usual activity:       On a scale of 0-10, during the past 24 hours, pain has interfered with your sleep:      On a scale of 0-10, during the past 24 hours, pain has affected your mood:       On a scale of 0-10, during the past 24 hours, pain has contributed to your stress:       On a scale of 0-10, what is your overall pain Rating: 6        Below pertinent information reviewed with patient:  Past Medical History:   Diagnosis Date    DVT (deep venous thrombosis) (CMS HCC)     Left Calf    Tendonitis of wrist, right     Tinnitus of both ears            Allergies   Allergen Reactions    Amoxicillin  Other Adverse Reaction (Add comment)     Mouth Blisters/Swelling    Augmentin [Amoxicillin-Pot Clavulanate]      Blisters in mouth    Propoxyphene Nausea/ Vomiting       Past Surgical History:   Procedure Laterality Date    COLONOSCOPY  08/27/2018    Dr. Abbe [place]    ESOPHAGOGASTRODUODENOSCOPY  08/27/2018    Dr. Arley Phenix DILATION AND CURETTAGE      URETHRAL DILATION      when 57 years old           Social History        Objective:    ED Triage Vitals [02/12/23 0946]   BP (Non-Invasive) (!) 148/89   Heart Rate 77   Respiratory Rate 18   Temperature 36.5 C (97.7 F)   SpO2 97 %   Weight 97.5 kg (215 lb)   Height 1.702 m (5\' 7" )      Filed Vitals:    02/12/23 1145 02/12/23 1200 02/12/23 1215 02/12/23 1230   BP: 136/77 123/77 131/72 137/79   Pulse:       Resp:       Temp:       SpO2: 91% 92% 91% 95%       Nursing notes and vital signs reviewed.    Constitutional - No acute distress.  Alert and Active.  HEENT - Normocephalic. Atraumatic. PERRL. EOMI. Conjunctiva clear. Oropharynx with no erythema, lesions, or exudates. Moist mucous membranes.   Neck - Trachea midline. No stridor. No hoarseness.  Cardiac - Regular rate and rhythm. No murmurs, rubs, or gallops.  Respiratory - Clear to auscultation bilaterally. No rales, wheezes or rhonchi.  Abdomen -  Non-tender, soft, non-distended. No rebound or guarding.   Musculoskeletal - Marked tenderness of lower back in low lumbosacral region. Extremities with no clubbing, cyanosis or edema.  Skin - Warm and dry, without any rashes or other lesions.  Neuro - Cranial nerves II-XII are grossly intact.  Moving all extremities symmetrically.    Any pertinent labs and imaging obtained during this encounter reviewed below in MDM.    MDM/ED Course:    Patient has significant discomfort and required pain management in the ED. Morphine gave good relief.      She has a fracture of the left sacral ala and is unable to ambulate at home due to pain. She is admitted to the hospitalist service and is willing to go to rehab.    Medical Decision Making  Amount and/or Complexity of Data Reviewed  Labs: ordered. Decision-making details documented in ED Course.  Radiology: ordered. Decision-making details documented in ED Course.    Risk  Prescription drug management.  Decision regarding hospitalization.             Orders Placed This Encounter    CT LUMBAR SPINE WO IV CONTRAST    CT ABDOMEN PELVIS WO IV CONTRAST    CBC/DIFF    COMPREHENSIVE METABOLIC PANEL, NON-FASTING    URINALYSIS, MACROSCOPIC AND MICROSCOPIC W/CULTURE REFLEX [PRN ONLY]    CBC WITH DIFF    URINALYSIS, MACROSCOPIC    URINALYSIS, MICROSCOPIC    EXTRA TUBES     BLUE TOP TUBE    GOLD TOP TUBE    LIGHT GREEN TOP TUBE    GRAY TOP TUBE    NS bolus infusion 1,000 mL    morphine 2 mg/mL injection    ondansetron (ZOFRAN) 2 mg/mL injection           Impression:   Clinical Impression   Fracture of sacrum (CMS HCC) (Primary)   Difficulty walking       Disposition: Admitted          Portions of this note may have been dictated using voice recognition software.     Cain Saupe, MD  [facility] ED    -----------------------  Results for orders placed or performed during the hospital encounter of 02/12/23 (from the past 12 hour(s))   COMPREHENSIVE METABOLIC PANEL, NON-FASTING   Result Value Ref Range    SODIUM 142 136 - 145 mmol/L    POTASSIUM 3.3 (L) 3.5 - 5.1 mmol/L    CHLORIDE 107 98 - 107 mmol/L    CO2 TOTAL 26 21 - 31 mmol/L    ANION GAP 9 4 - 13 mmol/L    BUN 16 7 - 25 mg/dL    CREATININE 3.55 7.32 - 1.30 mg/dL    BUN/CREA RATIO 20 6 - 22    ESTIMATED GFR 84 >59 mL/min/1.72m^2    ALBUMIN 4.1 3.5 - 5.7 g/dL    CALCIUM 9.0 8.6 - 20.2 mg/dL    GLUCOSE 542 74 - 706 mg/dL    ALKALINE PHOSPHATASE 73 34 - 104 U/L    ALT (SGPT) 16 7 - 52 U/L    AST (SGOT) 15 13 - 39 U/L    BILIRUBIN TOTAL 0.7 0.3 - 1.0 mg/dL    PROTEIN TOTAL 6.9 6.4 - 8.9 g/dL    ALBUMIN/GLOBULIN RATIO 1.5 (H) 0.8 - 1.4    OSMOLALITY, CALCULATED 285 270 - 290 mOsm/kg    CALCIUM, CORRECTED 8.9 8.9 - 10.8 mg/dL    GLOBULIN 2.8 (L) 2.9 - 5.4  CBC WITH DIFF   Result Value Ref Range    WBC 8.3 3.8 - 11.8 x10^3/uL    RBC 3.88 3.63 - 4.92 x10^6/uL    HGB 11.9 10.9 - 14.3 g/dL    HCT 16.1 09.6 - 04.5 %    MCV 89.9 75.5 - 95.3 fL    MCH 30.6 24.7 - 32.8 pg    MCHC 34.0 32.3 - 35.6 g/dL    RDW 40.9 81.1 - 91.4 %    PLATELETS 212 140 - 440 x10^3/uL    MPV 7.4 (L) 7.9 - 10.8 fL    NEUTROPHIL % 82 (H) 43 - 77 %    LYMPHOCYTE % 10 (L) 16 - 44 %    MONOCYTE % 8 5 - 13 %    EOSINOPHIL % 0 (L) 1 - 7 %    BASOPHIL % 0 0 - 1 %    NEUTROPHIL # 6.80 1.85 - 7.80 x10^3/uL    LYMPHOCYTE # 0.80 (L) 1.00 - 3.00 x10^3/uL     MONOCYTE # 0.60 0.30 - 1.00 x10^3/uL    EOSINOPHIL # 0.00 0.00 - 0.50 x10^3/uL    BASOPHIL # 0.00 0.00 - 0.10 x10^3/uL     CT LUMBAR SPINE WO IV CONTRAST   Final Result   THERE IS NO ACUTE LUMBAR SPINE FRACTURE. THERE IS AN ACUTE NONDISPLACED FRACTURE TO THE LEFT SACRUM.         One or more dose reduction techniques were used (e.g., Automated exposure control, adjustment of the mA and/or kV according to patient size, use of iterative reconstruction technique).         Radiologist location ID: [id number]         CT ABDOMEN PELVIS WO IV CONTRAST   Final Result   Nondisplaced left sacral ala fracture          One or more dose reduction techniques were used (e.g., Automated exposure control, adjustment of the mA and/or kV according to patient size, use of iterative reconstruction technique).         Radiologist location ID: [id number]

## 2023-02-13 LAB — COMPREHENSIVE METABOLIC PANEL, NON-FASTING
ALBUMIN/GLOBULIN RATIO: 1.3 (ref 0.8–1.4)
ALBUMIN: 3.6 g/dL (ref 3.5–5.7)
ALKALINE PHOSPHATASE: 66 U/L (ref 34–104)
ALT (SGPT): 15 U/L (ref 7–52)
ANION GAP: 12 mmol/L (ref 4–13)
AST (SGOT): 13 U/L (ref 13–39)
BILIRUBIN TOTAL: 0.6 mg/dL (ref 0.3–1.0)
BUN/CREA RATIO: 26 — ABNORMAL HIGH (ref 6–22)
BUN: 18 mg/dL (ref 7–25)
CALCIUM, CORRECTED: 8.8 mg/dL — ABNORMAL LOW (ref 8.9–10.8)
CALCIUM: 8.5 mg/dL — ABNORMAL LOW (ref 8.6–10.3)
CHLORIDE: 107 mmol/L (ref 98–107)
CO2 TOTAL: 22 mmol/L (ref 21–31)
CREATININE: 0.7 mg/dL (ref 0.60–1.30)
ESTIMATED GFR: 102 mL/min/{1.73_m2} (ref >59)
GLOBULIN: 2.8 — ABNORMAL LOW (ref 2.9–5.4)
GLUCOSE: 107 mg/dL (ref 74–109)
OSMOLALITY, CALCULATED: 284 mOsm/kg (ref 270–290)
POTASSIUM: 3.4 mmol/L — ABNORMAL LOW (ref 3.5–5.1)
PROTEIN TOTAL: 6.4 g/dL (ref 6.4–8.9)
SODIUM: 141 mmol/L (ref 136–145)

## 2023-02-13 LAB — CBC WITH DIFF
BASOPHIL #: 0 10*3/uL (ref 0.00–0.10)
BASOPHIL %: 0 % (ref 0–1)
EOSINOPHIL #: 0.1 10*3/uL (ref 0.00–0.50)
EOSINOPHIL %: 1 % (ref 1–7)
HCT: 32.3 % (ref 31.2–41.9)
HGB: 11.1 g/dL (ref 10.9–14.3)
LYMPHOCYTE #: 1.7 10*3/uL (ref 1.00–3.00)
LYMPHOCYTE %: 29 % (ref 16–44)
MCH: 31.1 pg (ref 24.7–32.8)
MCHC: 34.4 g/dL (ref 32.3–35.6)
MCV: 90.6 fL (ref 75.5–95.3)
MONOCYTE #: 0.5 10*3/uL (ref 0.30–1.00)
MONOCYTE %: 9 % (ref 5–13)
MPV: 7.4 fL — ABNORMAL LOW (ref 7.9–10.8)
NEUTROPHIL #: 3.6 10*3/uL (ref 1.85–7.80)
NEUTROPHIL %: 62 % (ref 43–77)
PLATELETS: 198 10*3/uL (ref 140–440)
RBC: 3.56 10*6/uL — ABNORMAL LOW (ref 3.63–4.92)
RDW: 13.3 % (ref 12.3–17.7)
WBC: 5.8 10*3/uL (ref 3.8–11.8)

## 2023-02-13 LAB — URINALYSIS, MICROSCOPIC
RBCS: <1 /hpf (ref <4)
WBCS: 1 /hpf (ref <6)

## 2023-02-13 LAB — URINALYSIS, MACROSCOPIC
BILIRUBIN: NEGATIVE mg/dL
BLOOD: NEGATIVE mg/dL
GLUCOSE: NEGATIVE mg/dL
KETONES: NEGATIVE mg/dL
LEUKOCYTES: NEGATIVE WBCs/uL
NITRITE: NEGATIVE
PH: 5.5 (ref 5.0–9.0)
PROTEIN: NEGATIVE mg/dL
SPECIFIC GRAVITY: 1.009 (ref 1.002–1.030)
UROBILINOGEN: NORMAL mg/dL

## 2023-02-13 LAB — PHOSPHORUS: PHOSPHORUS: 2.8 mg/dL — ABNORMAL LOW (ref 3.7–7.2)

## 2023-02-13 LAB — VITAMIN D 25 TOTAL: VITAMIN D 25, TOTAL: 34.32 ng/mL (ref 30.00–100.00)

## 2023-02-13 LAB — THYROXINE, FREE (FREE T4): THYROXINE (T4), FREE: 0.74 ng/dL (ref 0.58–1.64)

## 2023-02-13 LAB — MAGNESIUM: MAGNESIUM: 1.9 mg/dL (ref 1.9–2.7)

## 2023-02-13 LAB — THYROID STIMULATING HORMONE WITH FREE T4 REFLEX: TSH: 7.179 u[IU]/mL — ABNORMAL HIGH (ref 0.450–5.330)

## 2023-02-13 MED ORDER — PANTOPRAZOLE 40 MG TABLET,DELAYED RELEASE
40.0000 mg | DELAYED_RELEASE_TABLET | Freq: Every day | ORAL | Status: DC
Start: 2023-02-13 — End: 2023-02-14
  Administered 2023-02-13 – 2023-02-14 (×2): 40 mg via ORAL
  Filled 2023-02-13 (×2): qty 1

## 2023-02-13 MED ORDER — ATORVASTATIN 20 MG TABLET
20.0000 mg | ORAL_TABLET | Freq: Every evening | ORAL | Status: DC
Start: 2023-02-13 — End: 2023-02-15
  Administered 2023-02-13 – 2023-02-14 (×2): 20 mg via ORAL
  Filled 2023-02-13 (×2): qty 1

## 2023-02-13 MED ORDER — LEVOTHYROXINE 150 MCG TABLET
75.0000 ug | ORAL_TABLET | Freq: Every morning | ORAL | Status: DC
Start: 2023-02-13 — End: 2023-02-15
  Administered 2023-02-13 – 2023-02-15 (×3): 75 ug via ORAL
  Filled 2023-02-13 (×3): qty 1

## 2023-02-13 NOTE — PT Evaluation (Signed)
[facility]  [address]  [place], [address]  [phone]  (Fax) [phone]  Rehabilitation Services  Physical Therapy Inpatient Initial Evaluation    Patient Name: Dominique Carrillo  Date of Birth: 05-24-1966  Height: Height: 170.2 cm (5\' 7" )  Weight: Weight: 97.5 kg (215 lb)  Room/Bed: 375/A  Payor: [employer] MEDICAID / Plan: [employer] [place] MEDICAID / Product Type: Medicaid [place] /       PMH:  Past Medical History:   Diagnosis Date    DVT (deep venous thrombosis) (CMS HCC)     Left Calf    Hypothyroidism     Osteopenia     Tendonitis of wrist, right     Tinnitus of both ears     Vitamin D deficiency            Assessment:      (P) Patient is a 57 year old female admitted 8/11 due to a sacral fracture. Patient was agreeable to PT evaluation upon arrival this date. Patient completed supine > sitting EOB and scooting requiring SBA, patient completed sitting EOB > supine requiring minimal assistance. Patient completed STS using FWW requiring CGA and ambulated 20' using FWW requiring CGA. Patient requires increased time to complete functional mobility due to increased pain, but is motivated to participate. Patient is a good candidate for skilled PT intervention during hospital admission to improve endurance, balance, strength, ROM, and independence with functional mobility.    Discharge Needs:    Equipment Recommendation: (P) front wheeled walker      The patient presents with mobility limitations due to impaired balance, impaired range of motion, impaired strength, and impaired functional activity tolerance that significantly impair/prevent patient's ability to participate in mobility-related activities of daily living (MRADLs) including  ambulation and transfers in order to safely complete, toileting, bathing, food preparation, working, laundering/household tasks, safely entering/exiting the home, in reasonable time. This functional mobility deficit can be sufficiently resolved  with the use of a (P) front wheeled walker  in order to decrease the risk of falls, morbidity, and mortality in performance of these MRADLs.  Patient is able to safely use this assistive device.    Discharge Disposition: (P) TBD    JUSTIFICATION OF DISCHARGE RECOMMENDATION   Based on current diagnosis, functional performance prior to admission, and current functional performance, this patient requires continued PT services in (P) TBD in order to achieve significant functional improvements in these deficit areas: (P) aerobic capacity/endurance, gait, locomotion, and balance, muscle performance, posture, ROM (range of motion).        Plan:   Current Intervention: (P) balance training, bed mobility training, gait training, home exercise program, patient/family education, strengthening, stretching, transfer training, stair training, ROM (range of motion)  To provide physical therapy services (P) other (see comments) (1-3x/day Monday to Saturday.)  for duration of (P) until discharge.    The risks/benefits of therapy have been discussed with the patient/caregiver and he/she is in agreement with the established plan of care.       Subjective & Objective     Past Medical History:   Diagnosis Date    DVT (deep venous thrombosis) (CMS HCC)     Left Calf    Hypothyroidism     Osteopenia     Tendonitis of wrist, right     Tinnitus of both ears     Vitamin D deficiency             Past Surgical History:  Procedure Laterality Date    COLONOSCOPY  08/27/2018    Dr. Abbe [place]    ESOPHAGOGASTRODUODENOSCOPY  08/27/2018    Dr. Arley [place]ix DILATION AND CURETTAGE      URETHRAL DILATION      when 57 years old                 02/13/23 1004   Rehab Session   Document Type evaluation   PT Visit Date 02/13/23   General Information   Patient Profile Reviewed yes   Pertinent History of Current Functional Problem Patient is a 57 year old female admitted 8/11 after a recent fall. Patient has a PMH of DVT, hypothyroidism, and osteopenia. PT was  consulted for evaluation.   Medical Lines Telemetry   Respiratory Status room air   Existing Precautions/Restrictions no known precautions/limitations   Mutuality/Individual Preferences   Anxieties, Fears or Concerns Concerned about falling and pain.   Individualized Care Needs Assistance with ADLs, monitoring of vitals.   Patient-Specific Goals (Include Timeframe) Go home when better, have improvement in pain.   Plan of Care Reviewed With patient   Living Environment   Lives With alone   Living Arrangements mobile home   Home Assessment: No Problems Identified   Home Accessibility stairs to enter home   Home Main Entrance   Stair Railings, Main Entrance railings on both sides of stairs   Number of Stairs, Main Entrance ten   Functional Level Prior   Ambulation 0 - independent   Transferring 0 - independent   Toileting 0 - independent   Bathing 0 - independent   Dressing 0 - independent   Eating 0 - independent   Communication 0 - understands/communicates without difficulty   Swallowing 0-->swallows foods/liquids without difficulty   Prior Functional Level Comment Patient reports being independent with ADLs prior to hospital admission and denies AD use.   Pre Treatment Status   Pre Treatment Patient Status Patient supine in bed   Support Present Pre Treatment  None   Communication Pre Treatment  Charge Nurse   Communication Pre Treatment Comment CN cleared patient for PT evaluation.   Cognitive Assessment/Interventions   Behavior/Mood Observations behavior appropriate to situation, WNL/WFL   Orientation Status oriented x 4   Attention WNL/WFL   Follows Commands WFL   Pre- Treatment Vital Signs   Vitals Comment Vitals not assessed at start of PT assessment as patient was not connected to pulse oximeter. Patient denies SOB or dizziness at start of treatment.   Pre-Treatment Pain   Pretreatment Pain Rating 5/10   Pre/Posttreatment Pain Comment sacral pain   RUE Assessment   RUE Assessment WFL- Within Functional Limits    LUE Assessment   LUE Assessment WFL- Within Functional Limits   RLE Assessment   RLE Assessment WFL- Within Functional Limits   LLE Assessment   LLE Assessment WFL- Within Functional Limits   Trunk Assessment   Trunk Assessment WFL-Within Functional Limits   Mobility Assessment/Training   Additional Documentation Bed Mobility Assessment/Treatment (Group);Transfer Assessment/Treatment (Group);Gait Assessment/Treatment (Group)   Bed Mobility Assessment/Treatment   Scoot/Bridge Independence stand-by assistance   Sit to Supine, Independence minimum assist (75% patient effort)   Supine-Sit Independence stand-by assistance   Transfer Assessment/Treatment   Sit-Stand-Sit, Assist Device walker, front wheeled   Sit-Stand Independence contact guard assist   Stand-Sit Independence contact guard assist   Gait Assessment/Treatment   Total Distance Ambulated 20   Gait Speed decreased   Deviations  narrow BOS;step length  decreased;stride length decreased;double stance time increased   Assistive Device  walker, front wheeled   Distance in Feet 20   Independence  contact guard assist   Post Treatment Status   Post Treatment Patient Status Patient supine in bed   Support Present Post Treatment  None   Communication Post Treatement Nurse   Patient Effort good   Post-Treatment Vital Signs   Post-treatment Heart Rate (beats/min)   (Vitals not assessed at start of PT assessment as patient was not connected to pulse oximeter. Patient denies SOB or dizziness at end of treatment.)   Post-Treatment Pain   Posttreatment Pain Rating 5/10   Physical Therapy Clinical Impression   Assessment Patient is a 57 year old female admitted 8/11 due to a sacral fracture. Patient was agreeable to PT evaluation upon arrival this date. Patient completed supine > sitting EOB and scooting requiring SBA, patient completed sitting EOB > supine requiring minimal assistance. Patient completed STS using FWW requiring CGA and ambulated 20' using FWW requiring CGA.  Patient requires increased time to complete functional mobility due to increased pain, but is motivated to participate. Patient is a good candidate for skilled PT intervention during hospital admission to improve endurance, balance, strength, ROM, and independence with functional mobility.   Criteria for Skilled Therapeutic yes   Impairments Found (describe specific impairments) aerobic capacity/endurance;gait, locomotion, and balance;muscle performance;posture;ROM (range of motion)   Functional Limitations in Following  self-care;home management;community/leisure;work   Rehab Potential fair   Therapy Frequency other (see comments)  (1-3x/day Monday to Saturday.)   Predicted Duration of Therapy Intervention (days/wks) until discharge   Anticipated Equipment Needs at Discharge (PT) front wheeled walker   Anticipated Discharge Disposition TBD   Evaluation Complexity Justification   Patient History: Co-morbidity/factors that impact Plan of Care 3 or more that impact Plan of Care   Examination Components 3 or more Exam elements addressed   Presentation Evolving: Symptoms, complaints, characteristics of condition changing &/or cognitive deficits present   Clinical Decision Making Moderate complexity   Evaluation Complexity Moderate complexity   Care Plan Goals   PT Rehab Goals Bed Mobility Goal;Gait Training Goal;Stairs Training Goal;Transfer Training Goal   Planned Therapy Interventions, PT Eval   Planned Therapy Interventions (PT) balance training;bed mobility training;gait training;home exercise program;patient/family education;strengthening;stretching;transfer training;stair training;ROM (range of motion)   Stairs Training Goal   Stairs Training Goal, Date Established 02/13/23   Stairs Training Goal, Time to Achieve by discharge   Stairs Training Goal, Independence Level modified independence   Stairs Training Goal, Current Status other (see comments)  (not assesed)   Stairs Training Goal, Assist Device least  restrictive assistive device   Stairs Training Goal, Number of Stairs to Achieve 10   Transfer Training Goal   Transfer Training Goal, Date Established 02/13/23   Transfer Training Goal, Time to Achieve by discharge   Transfer Training Goal, Activity Type all transfers   Transfer Training Goal, Current Status contact guard assist   Transfer Training Goal, Independence Level modified independence   Transfer Training Goal, Assist Device least restrictrictive assistive device   Gait Training  Goal, Distance to Achieve   Gait Training  Goal, Date Established 02/13/23   Gait Training  Goal, Time to Achieve by discharge   Gait Training  Goal, Independence Level modified independence   Gait Training  Goal, Assist Device least restricted assistive device   Gait Training  Goal, Distance to Achieve 150   Bed Mobility Goal   Bed Mobility Goal, Date Established  02/13/23   Bed Mobility Goal, Time to Achieve by discharge   Bed Mobility Goal, Activity Type all bed mobility activities   Bed Mobility Goal, Current Status other (see comments)  (Patient required SBA for supine > sitting EOB and scooting, and required minimal assistance for sitting EOB > supine.)   Bed Mobility Goal, Independence Level independent   Bed Mobility Goal, Assistive Device least restrictive assistive device   Physical Therapy Time and Intention   Total PT Minutes: 20       INTERVENTION MINUTES: EVALUATION 20 minutes    EVALUATION COMPLEXITY : CLINICAL DECISION MAKING OF MODERATE COMPLEXITY AS INDICATED BY PMHX, PHYSICAL THERAPY ASSESSMENT OF MUSCULOSKELETAL AND NEUROLOGICAL SYSTEMS AND ACTIVITY LIMITATIONS. CLINICAL PRESENTATION HAS CHANGING CHARACTERISTICS.    Therapist:     Phineas Douglas, PT  02/13/2023, 12:09

## 2023-02-13 NOTE — PT Treatment (Signed)
[facility]  [address]  [place], [address]  [phone]  (Fax) [phone]  Rehabilitation Department  Physical Therapy Daily Inpatient Note    Date: 02/13/2023  Patient's Name: Dominique Carrillo  Date of Birth: 1966-02-16  Height: Height: 170.2 cm (5\' 7" )  Weight: Weight: 97.5 kg (215 lb)      Plan: Will continue under current POC.     Discharge Recommendation: Home and Home with Home Health        Subjective/Objective/Assessment:  Flowsheet    02/13/23 1318   Rehab Session   Document Type therapy progress note (daily note)   PT Visit Date 02/13/23   General Information   Patient Profile Reviewed yes   Medical Lines Telemetry   Respiratory Status room air   Existing Precautions/Restrictions fall precautions   Pre Treatment Status   Pre Treatment Patient Status Patient supine in bed   Support Present Pre Treatment  None   Communication Pre Treatment  Charge Nurse   Communication Pre Treatment Comment cleared for PT   Pre-Treatment Pain   Pretreatment Pain Rating 5/10   Pre/Posttreatment Pain Comment sacral pain   Bed Mobility Assessment/Treatment   Supine-Sit Independence stand-by assistance   Sit to Supine, Independence stand-by assistance;modified independence   Bed Mobility, Assistive Device overhead trapeze   Transfer Assessment/Treatment   Sit-Stand Independence stand-by assistance   Stand-Sit Independence stand-by assistance   Sit-Stand-Sit, Assist Device walker, front wheeled   Gait Assessment/Treatment   Total Distance Ambulated 30   Independence  stand-by assistance   Deviations  narrow BOS;step length decreased   Impairments  pain   Comment small short steps mainly due to pain   Balance   Sitting Balance: Static good balance   Sit-to-Stand Balance good balance   Standing Balance: Static fair + balance   Post Treatment Status   Post Treatment Patient Status Patient safety alarm activated   Patient Effort good   Post-Treatment Pain   Posttreatment Pain Rating 5/10    Cognitive Assessment/Intervention   Behavior/Mood Observations behavior appropriate to situation, WNL/WFL   Physical Therapy Time and Intention   Total PT Minutes: 12     Patient in the bed, transferred in and oob sba, gaited with rw 52ft with sba, will do steps tomorrow as well, no lob, bed alarmed after session            Intervention minutes: GAIT TRAINING 12 MINUTES    THERAPIST  Lane Hacker, PTA  02/13/2023, 15:24

## 2023-02-13 NOTE — Care Plan (Signed)
Problem: Adult Inpatient Plan of Care  Goal: Plan of Care Review  Outcome: Ongoing (see interventions/notes)  Goal: Patient-Specific Goal (Individualized)  Outcome: Ongoing (see interventions/notes)  Flowsheets (Taken 02/12/2023 2026)  Individualized Care Needs: medicated as needed, assist with ADLs  Anxieties, Fears or Concerns: Pain  Patient-Specific Goals (Include Timeframe): Improve mobility  Goal: Absence of Hospital-Acquired Illness or Injury  Outcome: Ongoing (see interventions/notes)  Intervention: Identify and Manage Fall Risk  Recent Flowsheet Documentation  Taken 02/13/2023 0000 by Joneen Roach, RN  Safety Promotion/Fall Prevention:   activity supervised   fall prevention program maintained   safety round/check completed   nonskid shoes/slippers when out of bed  Taken 02/12/2023 2200 by Joneen Roach, RN  Safety Promotion/Fall Prevention:   activity supervised   fall prevention program maintained   safety round/check completed   nonskid shoes/slippers when out of bed  Taken 02/12/2023 2026 by Joneen Roach, RN  Safety Promotion/Fall Prevention:   activity supervised   fall prevention program maintained   safety round/check completed   nonskid shoes/slippers when out of bed  Intervention: Prevent Skin Injury  Recent Flowsheet Documentation  Taken 02/12/2023 2200 by Joneen Roach, RN  Skin Protection: adhesive use limited  Intervention: Prevent Infection  Recent Flowsheet Documentation  Taken 02/12/2023 2026 by Joneen Roach, RN  Infection Prevention:   rest/sleep promoted   single patient room provided   promote handwashing  Goal: Optimal Comfort and Wellbeing  Outcome: Ongoing (see interventions/notes)  Goal: Rounds/Family Conference  Outcome: Ongoing (see interventions/notes)     Problem: Orthopaedic Fracture  Goal: Absence of Bleeding  Outcome: Ongoing (see interventions/notes)  Intervention: Monitor and Manage Fracture Bleeding  Recent Flowsheet Documentation  Taken 02/13/2023 0000 by Joneen Roach, RN  Bleeding  Management: dressing monitored  Taken 02/12/2023 2200 by Joneen Roach, RN  Bleeding Management: dressing monitored  Taken 02/12/2023 2026 by Joneen Roach, RN  Bleeding Management: dressing monitored  Goal: Effective Bowel Elimination  Outcome: Ongoing (see interventions/notes)  Goal: Absence of Embolism Signs and Symptoms  Outcome: Ongoing (see interventions/notes)  Goal: Fracture Stability  Outcome: Ongoing (see interventions/notes)  Goal: Optimal Functional Ability  Outcome: Ongoing (see interventions/notes)  Intervention: Optimize Functional Ability  Recent Flowsheet Documentation  Taken 02/12/2023 2026 by Joneen Roach, RN  Self-Care Promotion: independence encouraged  Activity Management: up to bedside commode  Goal: Absence of Infection Signs and Symptoms  Outcome: Ongoing (see interventions/notes)  Goal: Effective Tissue Perfusion  Outcome: Ongoing (see interventions/notes)  Goal: Optimal Pain Control and Function  Outcome: Ongoing (see interventions/notes)  Goal: Effective Oxygenation and Ventilation  Outcome: Ongoing (see interventions/notes)  Intervention: Promote Airway Secretion Clearance  Recent Flowsheet Documentation  Taken 02/12/2023 2026 by Joneen Roach, RN  Activity Management: up to bedside commode     Problem: Fall Injury Risk  Goal: Absence of Fall and Fall-Related Injury  Outcome: Ongoing (see interventions/notes)  Intervention: Identify and Manage Contributors  Recent Flowsheet Documentation  Taken 02/12/2023 2026 by Joneen Roach, RN  Self-Care Promotion: independence encouraged  Intervention: Promote Injury-Free Environment  Recent Flowsheet Documentation  Taken 02/13/2023 0000 by Joneen Roach, RN  Safety Promotion/Fall Prevention:   activity supervised   fall prevention program maintained   safety round/check completed   nonskid shoes/slippers when out of bed  Taken 02/12/2023 2200 by Joneen Roach, RN  Safety Promotion/Fall Prevention:   activity supervised   fall prevention program maintained    safety round/check completed   nonskid shoes/slippers when out of bed  Taken 02/12/2023 2026 by Tiburcio Pea,  Delice Bison, RN  Safety Promotion/Fall Prevention:   activity supervised   fall prevention program maintained   safety round/check completed   nonskid shoes/slippers when out of bed    Care plan ongoing. Medicate for pain as needed. Assist with ADLs.

## 2023-02-13 NOTE — Progress Notes (Signed)
[facility]   Progress Note    Dominique Carrillo  Date of service: 02/13/2023  Date of Admission:  02/12/2023  Hospital Day:  LOS: 1 day     Subjective:  No overnight events reported.  The patient denies any subjective fevers/chills and offers no new complaints at present.  She expressed significant amount of difficulty with movement and performing simple tasks.  No additional issues noted.     Review of Systems:    As mentioned above.     Objective:      Vital Signs:  Vitals:    02/13/23 0700 02/13/23 1114 02/13/23 1201 02/13/23 1203   BP: 132/80 120/74     Pulse: 65 81  88   Resp: 16 16 16     Temp: 36.5 C (97.7 F) 36.5 C (97.7 F)     SpO2: 96% 98%     Weight:       Height:       BMI:                I/O:  I/O last 24 hours:    Intake/Output Summary (Last 24 hours) at 02/13/2023 1327  Last data filed at 02/13/2023 1315  Gross per 24 hour   Intake 600 ml   Output --   Net 600 ml         acetaminophen (TYLENOL) tablet, 650 mg, Oral, Q4H PRN  enoxaparin PF (LOVENOX) 40 mg/0.4 mL SubQ injection, 40 mg, Subcutaneous, Daily  HYDROcodone-acetaminophen (NORCO) 5-325 mg per tablet, 1 Tablet, Oral, Q4H PRN  ondansetron (ZOFRAN) 2 mg/mL injection, 4 mg, Intravenous, Q6H PRN  polyethylene glycol (MIRALAX) oral packet, 17 g, Oral, Daily  sennosides-docusate sodium (SENOKOT-S) 8.6-50mg  per tablet, 1 Tablet, Oral, 2x/day PRN        Physical Exam:    Gen: NAD, pleasant, alert/oriented x 3, speaking in full sentences  HEENT:  Atraumatic, moist mucous membranes  Neck: supple, no TM  Heart:  Normal S1/S2, no extra heart sounds noted  Chest:  Clear to auscultation bilaterally, good aeration to bases  Abdomen:+BS, soft,NT/ND, no rebound/guarding, normal contour  Extr: no c/c/e  Neurological:CN II-XII grossly normal, no focal deficits  Skin: Skin is warm and dry, no erythema noted    Labs:  Results for orders placed or performed during the hospital encounter of 02/12/23 (from the past 24 hour(s))   URINALYSIS, MACROSCOPIC  AND MICROSCOPIC W/CULTURE REFLEX [PRN ONLY]    Specimen: Urine, Clean Catch    Narrative    The following orders were created for panel order URINALYSIS, MACROSCOPIC AND MICROSCOPIC W/CULTURE REFLEX [PRN ONLY].  Procedure                               Abnormality         Status                     ---------                               -----------         ------                     URINALYSIS, MACROSCOPIC[[id number]]      Normal              Final result  URINALYSIS, MICROSCOPIC[[id number]]      Abnormal            Final result                 Please view results for these tests on the individual orders.   URINALYSIS, MACROSCOPIC   Result Value Ref Range    COLOR Yellow Colorless, Light Yellow, Yellow    APPEARANCE Clear Clear    SPECIFIC GRAVITY 1.009 1.002 - 1.030    PH 5.5 5.0 - 9.0    LEUKOCYTES Negative Negative, 100  WBCs/uL    NITRITE Negative Negative    PROTEIN Negative Negative, 10 , 20  mg/dL    GLUCOSE Negative Negative, 30  mg/dL    KETONES Negative Negative, Trace mg/dL    BILIRUBIN Negative Negative, 0.5 mg/dL    BLOOD Negative Negative, 0.03 mg/dL    UROBILINOGEN Normal Normal mg/dL   URINALYSIS, MICROSCOPIC   Result Value Ref Range    MUCOUS Rare (A) (none) /hpf    RBCS <1 <4 /hpf    WBCS 1 <6 /hpf   CBC/DIFF    Narrative    The following orders were created for panel order CBC/DIFF.  Procedure                               Abnormality         Status                     ---------                               -----------         ------                     CBC WITH QION[[id number]]                Abnormal            Final result                 Please view results for these tests on the individual orders.   COMPREHENSIVE METABOLIC PANEL, NON-FASTING   Result Value Ref Range    SODIUM 141 136 - 145 mmol/L    POTASSIUM 3.4 (L) 3.5 - 5.1 mmol/L    CHLORIDE 107 98 - 107 mmol/L    CO2 TOTAL 22 21 - 31 mmol/L    ANION GAP 12 4 - 13 mmol/L    BUN 18 7 - 25 mg/dL    CREATININE 2.44 0.10 - 1.30  mg/dL    BUN/CREA RATIO 26 (H) 6 - 22    ESTIMATED GFR 102 >59 mL/min/1.10m^2    ALBUMIN 3.6 3.5 - 5.7 g/dL    CALCIUM 8.5 (L) 8.6 - 10.3 mg/dL    GLUCOSE 272 74 - 536 mg/dL    ALKALINE PHOSPHATASE 66 34 - 104 U/L    ALT (SGPT) 15 7 - 52 U/L    AST (SGOT) 13 13 - 39 U/L    BILIRUBIN TOTAL 0.6 0.3 - 1.0 mg/dL    PROTEIN TOTAL 6.4 6.4 - 8.9 g/dL    ALBUMIN/GLOBULIN RATIO 1.3 0.8 - 1.4    OSMOLALITY, CALCULATED 284 270 - 290 mOsm/kg    CALCIUM, CORRECTED 8.8 (L) 8.9 - 10.8 mg/dL    GLOBULIN 2.8 (L) 2.9 -  5.4    Narrative    Estimated Glomerular Filtration Rate (eGFR) is calculated using the CKD-EPI (2021) equation, intended for patients 55 years of age and older. If gender is not documented or "unknown", there will be no eGFR calculation.     MAGNESIUM   Result Value Ref Range    MAGNESIUM 1.9 1.9 - 2.7 mg/dL   PHOSPHORUS   Result Value Ref Range    PHOSPHORUS 2.8 (L) 3.7 - 7.2 mg/dL   THYROID STIMULATING HORMONE WITH FREE T4 REFLEX   Result Value Ref Range    TSH 7.179 (H) 0.450 - 5.330 uIU/mL   VITAMIN D 25 TOTAL   Result Value Ref Range    VITAMIN D 25, TOTAL 34.32 30.00 - 100.00 ng/mL   CBC WITH DIFF   Result Value Ref Range    WBC 5.8 3.8 - 11.8 x10^3/uL    RBC 3.56 (L) 3.63 - 4.92 x10^6/uL    HGB 11.1 10.9 - 14.3 g/dL    HCT 35.5 73.2 - 20.2 %    MCV 90.6 75.5 - 95.3 fL    MCH 31.1 24.7 - 32.8 pg    MCHC 34.4 32.3 - 35.6 g/dL    RDW 54.2 70.6 - 23.7 %    PLATELETS 198 140 - 440 x10^3/uL    MPV 7.4 (L) 7.9 - 10.8 fL    NEUTROPHIL % 62 43 - 77 %    LYMPHOCYTE % 29 16 - 44 %    MONOCYTE % 9 5 - 13 %    EOSINOPHIL % 1 1 - 7 %    BASOPHIL % 0 0 - 1 %    NEUTROPHIL # 3.60 1.85 - 7.80 x10^3/uL    LYMPHOCYTE # 1.70 1.00 - 3.00 x10^3/uL    MONOCYTE # 0.50 0.30 - 1.00 x10^3/uL    EOSINOPHIL # 0.10 0.00 - 0.50 x10^3/uL    BASOPHIL # 0.00 0.00 - 0.10 x10^3/uL   THYROXINE, FREE (FREE T4)   Result Value Ref Range    THYROXINE (T4), FREE 0.74 0.58 - 1.64 ng/dL        Imaging:    Results for orders placed or performed during the  hospital encounter of 02/12/23   CT LUMBAR SPINE WO IV CONTRAST     Status: None    Narrative    Dominique Carrillo    RADIOLOGIST: Wynema Birch, MD    CT LUMBAR SPINE WO IV CONTRAST performed on 02/12/2023 10:32 AM    CLINICAL HISTORY: pain after fall, left buttock/lower back.  pain after fall on Thursday, left buttock/lower back, no previous surgeries    TECHNIQUE:  Lumbar spine CT without contrast    COMPARISON: None.    FINDINGS:  Vertebrae:  Vertebral body heights are maintained.    Alignment:  No spondylolisthesis.    There are mild diffuse degenerative changes. There likely are areas of mild central canal stenosis.    Sacrum:  There is an acute nondisplaced fracture through the left aspect of the sacral body. This is obliquely oriented. There is no widening of the sacroiliac joints.    There is presacral edema. There is dependent atelectasis in the lung bases.        Impression    THERE IS NO ACUTE LUMBAR SPINE FRACTURE. THERE IS AN ACUTE NONDISPLACED FRACTURE TO THE LEFT SACRUM.      One or more dose reduction techniques were used (e.g., Automated exposure control, adjustment of the mA and/or kV according to  patient size, use of iterative reconstruction technique).      Radiologist location ID: [id number]     CT ABDOMEN PELVIS WO IV CONTRAST     Status: None    Narrative    Dominique Carrillo    RADIOLOGIST: Lucien Mons    CT ABDOMEN PELVIS WO IV CONTRAST performed on 02/12/2023 10:32 AM    CLINICAL HISTORY: fall, pain in right hip.  fall on Thursday, pain in right hip    TECHNIQUE:  Abdomen and pelvis CT without intravenous contrast.    COMPARISON:  None.    FINDINGS:  Noncontrast technique limits evaluation of the abdominal and pelvic viscera.    Lung bases: Clear    Liver:   Unremarkable.    Gallbladder:   Unremarkable.    Spleen:   Unremarkable.    Pancreas:   Unremarkable.    Adrenals:   Unremarkable.    Kidneys:   Unremarkable.    Bladder:  Unremarkable.    Uterus and Adnexa:  Unremarkable.    Bowel:    Unremarkable.    Appendix:  Normal.    Lymph nodes:  No suspicious lymph node enlargement.    Vasculature:   Major vascular structures are unremarkable.     Peritoneum / Retroperitoneum: No ascites.  No free air.    Bones:   There is a nondisplaced fracture of the left sacral ala.          Impression    Nondisplaced left sacral ala fracture       One or more dose reduction techniques were used (e.g., Automated exposure control, adjustment of the mA and/or kV according to patient size, use of iterative reconstruction technique).      Radiologist location ID: [id number]           Microbiology:  No results found for any visits on 02/12/23 (from the past 96 hour(s)).       Assessment/ Plan:   Sacral fracture  -sacral film shows nondisplaced sacral fracture  -PT/OT as per schedule  -conservative pain management strategy in place    Hypothyroidism  -TFTs reviewed  -Hormone supplementation Rx to continue     GERD  -PPI Rx to continue     Dyslipidemia  -statin Rx to continue   -stable; no active issues     Code Status:  Full code    Diet:  Regular    DVT Prophylaxis   -SCDs/Lovenox    Disposition:  Pending clinical course    I have spent 55 minutes care for this patient with over half in discussion of the diagnosis and the importance of compliance with the treatment plan.  The remainder of the time was spent examining the patient and discussing the plan of care patient's family/care team    Lalla Brothers, MD    This note was partially generated using MModal Fluency Direct system, and there may be some incorrect words, spellings, and punctuation that were not noted in checking the note before saving.

## 2023-02-14 DIAGNOSIS — E039 Hypothyroidism, unspecified: Secondary | ICD-10-CM

## 2023-02-14 DIAGNOSIS — E785 Hyperlipidemia, unspecified: Secondary | ICD-10-CM

## 2023-02-14 DIAGNOSIS — K219 Gastro-esophageal reflux disease without esophagitis: Secondary | ICD-10-CM

## 2023-02-14 DIAGNOSIS — S3210XA Unspecified fracture of sacrum, initial encounter for closed fracture: Principal | ICD-10-CM

## 2023-02-14 LAB — CBC WITH DIFF
BASOPHIL #: 0 10*3/uL (ref 0.00–0.10)
BASOPHIL %: 1 % (ref 0–1)
EOSINOPHIL #: 0.2 10*3/uL (ref 0.00–0.50)
EOSINOPHIL %: 3 % (ref 1–7)
HCT: 31.6 % (ref 31.2–41.9)
HGB: 11.1 g/dL (ref 10.9–14.3)
LYMPHOCYTE #: 1.6 10*3/uL (ref 1.00–3.00)
LYMPHOCYTE %: 33 % (ref 16–44)
MCH: 31.5 pg (ref 24.7–32.8)
MCHC: 35.2 g/dL (ref 32.3–35.6)
MCV: 89.5 fL (ref 75.5–95.3)
MONOCYTE #: 0.5 10*3/uL (ref 0.30–1.00)
MONOCYTE %: 9 % (ref 5–13)
MPV: 7.1 fL — ABNORMAL LOW (ref 7.9–10.8)
NEUTROPHIL #: 2.7 10*3/uL (ref 1.85–7.80)
NEUTROPHIL %: 54 % (ref 43–77)
PLATELETS: 209 10*3/uL (ref 140–440)
RBC: 3.54 10*6/uL — ABNORMAL LOW (ref 3.63–4.92)
RDW: 13.4 % (ref 12.3–17.7)
WBC: 5 10*3/uL (ref 3.8–11.8)

## 2023-02-14 LAB — COMPREHENSIVE METABOLIC PANEL, NON-FASTING
ALBUMIN/GLOBULIN RATIO: 1.6 — ABNORMAL HIGH (ref 0.8–1.4)
ALBUMIN: 3.5 g/dL (ref 3.5–5.7)
ALKALINE PHOSPHATASE: 63 U/L (ref 34–104)
ALT (SGPT): 17 U/L (ref 7–52)
ANION GAP: 5 mmol/L (ref 4–13)
AST (SGOT): 13 U/L (ref 13–39)
BILIRUBIN TOTAL: 0.6 mg/dL (ref 0.3–1.0)
BUN/CREA RATIO: 20 (ref 6–22)
BUN: 18 mg/dL (ref 7–25)
CALCIUM, CORRECTED: 8.9 mg/dL (ref 8.9–10.8)
CALCIUM: 8.5 mg/dL — ABNORMAL LOW (ref 8.6–10.3)
CHLORIDE: 105 mmol/L (ref 98–107)
CO2 TOTAL: 31 mmol/L (ref 21–31)
CREATININE: 0.89 mg/dL (ref 0.60–1.30)
ESTIMATED GFR: 77 mL/min/{1.73_m2} (ref >59)
GLOBULIN: 2.2 — ABNORMAL LOW (ref 2.9–5.4)
GLUCOSE: 103 mg/dL (ref 74–109)
OSMOLALITY, CALCULATED: 284 mOsm/kg (ref 270–290)
POTASSIUM: 3.7 mmol/L (ref 3.5–5.1)
PROTEIN TOTAL: 5.7 g/dL — ABNORMAL LOW (ref 6.4–8.9)
SODIUM: 141 mmol/L (ref 136–145)

## 2023-02-14 LAB — MAGNESIUM: MAGNESIUM: 1.9 mg/dL (ref 1.9–2.7)

## 2023-02-14 MED ORDER — KETOROLAC 30 MG/ML (1 ML) INJECTION SOLUTION
15.0000 mg | Freq: Four times a day (QID) | INTRAMUSCULAR | Status: AC
Start: 2023-02-14 — End: 2023-02-14
  Administered 2023-02-14: 15 mg via INTRAVENOUS
  Filled 2023-02-14: qty 1

## 2023-02-14 MED ORDER — SENNOSIDES 8.6 MG-DOCUSATE SODIUM 50 MG TABLET
2.0000 | ORAL_TABLET | Freq: Two times a day (BID) | ORAL | Status: DC | PRN
Start: 2023-02-14 — End: 2023-02-15

## 2023-02-14 MED ORDER — PANTOPRAZOLE 40 MG TABLET,DELAYED RELEASE
40.0000 mg | DELAYED_RELEASE_TABLET | Freq: Two times a day (BID) | ORAL | Status: DC
Start: 2023-02-14 — End: 2023-02-15
  Administered 2023-02-14 – 2023-02-15 (×2): 40 mg via ORAL
  Filled 2023-02-14 (×2): qty 1

## 2023-02-14 MED ORDER — OXYCODONE 5 MG TABLET
10.0000 mg | ORAL_TABLET | Freq: Four times a day (QID) | ORAL | Status: DC | PRN
Start: 2023-02-14 — End: 2023-02-15

## 2023-02-14 MED ORDER — LIDOCAINE 4 % TOPICAL PATCH
2.0000 | MEDICATED_PATCH | Freq: Every day | CUTANEOUS | Status: DC
Start: 2023-02-14 — End: 2023-02-15
  Administered 2023-02-14 – 2023-02-15 (×2): 2 via TRANSDERMAL
  Filled 2023-02-14 (×2): qty 2

## 2023-02-14 NOTE — Care Plan (Signed)
Problem: Adult Inpatient Plan of Care  Goal: Plan of Care Review  Outcome: Ongoing (see interventions/notes)  Goal: Patient-Specific Goal (Individualized)  Outcome: Ongoing (see interventions/notes)  Goal: Absence of Hospital-Acquired Illness or Injury  Outcome: Ongoing (see interventions/notes)  Goal: Optimal Comfort and Wellbeing  Outcome: Ongoing (see interventions/notes)  Goal: Rounds/Family Conference  Outcome: Ongoing (see interventions/notes)     Problem: Orthopaedic Fracture  Goal: Absence of Bleeding  Outcome: Ongoing (see interventions/notes)  Goal: Effective Bowel Elimination  Outcome: Ongoing (see interventions/notes)  Goal: Absence of Embolism Signs and Symptoms  Outcome: Ongoing (see interventions/notes)  Goal: Fracture Stability  Outcome: Ongoing (see interventions/notes)  Goal: Optimal Functional Ability  Outcome: Ongoing (see interventions/notes)  Goal: Absence of Infection Signs and Symptoms  Outcome: Ongoing (see interventions/notes)  Goal: Effective Tissue Perfusion  Outcome: Ongoing (see interventions/notes)  Goal: Optimal Pain Control and Function  Outcome: Ongoing (see interventions/notes)  Goal: Effective Oxygenation and Ventilation  Outcome: Ongoing (see interventions/notes)     Problem: Fall Injury Risk  Goal: Absence of Fall and Fall-Related Injury  Outcome: Ongoing (see interventions/notes)    Care plan ongoing. Medicate for pain as needed. Assist with ADLs.

## 2023-02-14 NOTE — PT Treatment (Signed)
[facility] Medicine [facility]  [address]  [place], [address]  [phone]  (Fax) [phone]  Rehabilitation Department  Physical Therapy Daily Inpatient Note    Date: 02/14/2023  Patient's Name: Dominique Carrillo  Date of Birth: Jan 13, 1966  Height: Height: 170.2 cm (5\' 7" )  Weight: Weight: 97.5 kg (215 lb)      Plan: Will continue under current POC.     Discharge Recommendation: Home and Home with Home Health        Subjective/Objective/Assessment:  Flowsheet    02/14/23 0843   Rehab Session   Document Type therapy progress note (daily note)   PT Visit Date 02/14/23   General Information   Patient Profile Reviewed yes   Medical Lines Telemetry   Respiratory Status room air   Existing Precautions/Restrictions fall precautions   Pre Treatment Status   Pre Treatment Patient Status Patient supine in bed   Support Present Pre Treatment  None   Communication Pre Treatment  Charge Nurse   Communication Pre Treatment Comment cleared for PT   Pre-Treatment Pain   Pretreatment Pain Rating 6/10   Pre/Posttreatment Pain Comment sacral pain   Bed Mobility Assessment/Treatment   Supine-Sit [place] stand-by assistance   Bed Mobility, Assistive Device bed rails;Head of Bed Elevated   Comment would not practice with the bed flat or no bed rail   Transfer Assessment/Treatment   Sit-Stand Independence stand-by assistance   Stand-Sit Independence stand-by assistance   Sit-Stand-Sit, Assist Device walker, front wheeled   Transfer Impairments pain   Gait Assessment/Treatment   Total Distance Ambulated 90   Independence  stand-by assistance   Assistive Device  walker, front wheeled   Impairments  pain   Comment slow steps   Stairs Assessment/Treatment   Number of Stairs 4   Handrail Location both sides   Independence Level stand-by assistance   Balance   Sitting Balance: Static good balance   Sitting, Dynamic (Balance) good balance   Sit-to-Stand Balance good balance   Standing Balance: Static good balance    Post Treatment Status   Post Treatment Patient Status Patient sitting on [facility]   Support Present Post Treatment  Nurse present   Patient Effort good   Post-Treatment Pain   Posttreatment Pain Rating 5/10   Cognitive Assessment/Intervention   Behavior/Mood Observations behavior appropriate to situation, WNL/WFL   Physical Therapy Time and Intention   Total PT Minutes: 30     Patient transferred supine to sit with bed rail and sba, stood with sba, gaited with rw 109ft, did 4 steps with sba, no lob, slow moving, left on the bedside potty with nsg assisting patient            Intervention minutes: GAIT TRAINING 30 MINUTES    THERAPIST  Lane Hacker, PTA  02/14/2023, 12:13

## 2023-02-14 NOTE — PT Treatment (Signed)
[facility] Medicine [facility]  [address]  [place], [address]  [phone]  (Fax) [phone]  Rehabilitation Department  Physical Therapy Daily Inpatient Note    Date: 02/14/2023  Patient's Name: Dominique Carrillo  Date of Birth: Feb 29, 1966  Height: Height: 170.2 cm (5\' 7" )  Weight: Weight: 97.5 kg (215 lb)      Plan: Will continue under current POC.     Discharge Recommendation: Home and Home with Home Health        Subjective/Objective/Assessment:  Flowsheet    02/14/23 1300   Rehab Session   Document Type therapy progress note (daily note)   PT Visit Date 02/14/23   General Information   Patient Profile Reviewed yes   Medical Lines PIV Line;Telemetry   Respiratory Status room air   Existing Precautions/Restrictions fall precautions   Pre Treatment Status   Pre Treatment Patient Status Patient sitting in bedside chair or w/c   Support Present Pre Treatment  None   Communication Pre Treatment  Charge Nurse   Communication Pre Treatment Comment cleared for PT   Pre-Treatment Pain   Pretreatment Pain Rating 5/10   Pre/Posttreatment Pain Comment sacral pain   Bed Mobility Assessment/Treatment   Sit to Supine, Independence stand-by assistance   Transfer Assessment/Treatment   Sit-Stand [place] stand-by assistance   Stand-Sit Independence stand-by assistance   Sit-Stand-Sit, Assist Device walker, front wheeled   Transfer Impairments pain   Gait Assessment/Treatment   Total Distance Ambulated 180   Independence  stand-by assistance   Impairments  pain   Comment slow gait   Balance   Sitting Balance: Static good balance   Sitting, Dynamic (Balance) good balance   Sit-to-Stand Balance good balance   Standing Balance: Static good balance   Post Treatment Status   Post Treatment Patient Status Patient sitting in bedside chair or w/c;Patient safety alarm activated   Support Present Post Treatment  None   Patient Effort good   Post-Treatment Pain   Posttreatment Pain Rating 3/10   Cognitive  Assessment/Intervention   Behavior/Mood Observations behavior appropriate to situation, WNL/WFL   Physical Therapy Time and Intention   Total PT Minutes: 30     Patient stood with sba, gaited with rw 180, slow gait, no lob, btb with sba, needs in reach, bed alarmed            Intervention minutes: GAIT TRAINING 30 MINUTES    THERAPIST  Lane Hacker, PTA  02/14/2023, 14:37

## 2023-02-14 NOTE — Progress Notes (Signed)
[facility]   Progress Note    Dominique Carrillo  Date of service: 02/14/2023  Date of Admission:  02/12/2023  Hospital Day:  LOS: 2 days     Subjective:  No overnight events reported.  The patient endorses adequate pain control on current regimen states that she has been able to mobilize with less difficulty compared to yesterday.    She offers no additional complaints at present.    Review of Systems:    As mentioned above.     Objective:      Vital Signs:  Vitals:    02/14/23 0726 02/14/23 0734 02/14/23 0800 02/14/23 1034   BP: 125/83 136/74  131/85   Pulse: 62 78  62   Resp: 18 18  18    Temp: 37.1 C (98.7 F) 36.5 C (97.7 F)  36.8 C (98.2 F)   SpO2: 98% 97% 97% 94%   Weight:       Height:       BMI:         I/O:  I/O last 24 hours:    Intake/Output Summary (Last 24 hours) at 02/14/2023 1155  Last data filed at 02/14/2023 1610  Gross per 24 hour   Intake 720 ml   Output 1100 ml   Net -380 ml       acetaminophen (TYLENOL) tablet, 650 mg, Oral, Q4H PRN  atorvastatin (LIPITOR) tablet, 20 mg, Oral, QPM  enoxaparin PF (LOVENOX) 40 mg/0.4 mL SubQ injection, 40 mg, Subcutaneous, Daily  HYDROcodone-acetaminophen (NORCO) 5-325 mg per tablet, 1 Tablet, Oral, Q4H PRN  levothyroxine (SYNTHROID) tablet, 75 mcg, Oral, QAM  ondansetron (ZOFRAN) 2 mg/mL injection, 4 mg, Intravenous, Q6H PRN  pantoprazole (PROTONIX) delayed release tablet, 40 mg, Oral, Daily  polyethylene glycol (MIRALAX) oral packet, 17 g, Oral, Daily        Physical Exam:    Gen: NAD, pleasant, alert/oriented x 3, speaking in full sentences sitting up in chair  HEENT:  Atraumatic, moist mucous membranes  Neck: supple, no TM  Heart:  Normal S1/S2, no extra heart sounds noted  Chest:  Clear to auscultation bilaterally, good aeration to bases  Abdomen:+BS, soft,NT/ND, no rebound/guarding, normal contour  Extr: no c/c/e  Neurological:  Moves all extremities equally  Skin: Skin is warm and dry, no erythema noted    Labs:  Results for orders placed  or performed during the hospital encounter of 02/12/23 (from the past 24 hour(s))   CBC/DIFF    Narrative    The following orders were created for panel order CBC/DIFF.  Procedure                               Abnormality         Status                     ---------                               -----------         ------                     CBC WITH RUEA[[id number]]                Abnormal            Final result  Please view results for these tests on the individual orders.   COMPREHENSIVE METABOLIC PANEL, NON-FASTING   Result Value Ref Range    SODIUM 141 136 - 145 mmol/L    POTASSIUM 3.7 3.5 - 5.1 mmol/L    CHLORIDE 105 98 - 107 mmol/L    CO2 TOTAL 31 21 - 31 mmol/L    ANION GAP 5 4 - 13 mmol/L    BUN 18 7 - 25 mg/dL    CREATININE 5.40 9.81 - 1.30 mg/dL    BUN/CREA RATIO 20 6 - 22    ESTIMATED GFR 77 >59 mL/min/1.47m^2    ALBUMIN 3.5 3.5 - 5.7 g/dL    CALCIUM 8.5 (L) 8.6 - 10.3 mg/dL    GLUCOSE 191 74 - 478 mg/dL    ALKALINE PHOSPHATASE 63 34 - 104 U/L    ALT (SGPT) 17 7 - 52 U/L    AST (SGOT) 13 13 - 39 U/L    BILIRUBIN TOTAL 0.6 0.3 - 1.0 mg/dL    PROTEIN TOTAL 5.7 (L) 6.4 - 8.9 g/dL    ALBUMIN/GLOBULIN RATIO 1.6 (H) 0.8 - 1.4    OSMOLALITY, CALCULATED 284 270 - 290 mOsm/kg    CALCIUM, CORRECTED 8.9 8.9 - 10.8 mg/dL    GLOBULIN 2.2 (L) 2.9 - 5.4    Narrative    Estimated Glomerular Filtration Rate (eGFR) is calculated using the CKD-EPI (2021) equation, intended for patients 76 years of age and older. If gender is not documented or "unknown", there will be no eGFR calculation.     MAGNESIUM   Result Value Ref Range    MAGNESIUM 1.9 1.9 - 2.7 mg/dL   CBC WITH DIFF   Result Value Ref Range    WBC 5.0 3.8 - 11.8 x10^3/uL    RBC 3.54 (L) 3.63 - 4.92 x10^6/uL    HGB 11.1 10.9 - 14.3 g/dL    HCT 29.5 62.1 - 30.8 %    MCV 89.5 75.5 - 95.3 fL    MCH 31.5 24.7 - 32.8 pg    MCHC 35.2 32.3 - 35.6 g/dL    RDW 65.7 84.6 - 96.2 %    PLATELETS 209 140 - 440 x10^3/uL    MPV 7.1 (L) 7.9 - 10.8 fL    NEUTROPHIL %  54 43 - 77 %    LYMPHOCYTE % 33 16 - 44 %    MONOCYTE % 9 5 - 13 %    EOSINOPHIL % 3 1 - 7 %    BASOPHIL % 1 0 - 1 %    NEUTROPHIL # 2.70 1.85 - 7.80 x10^3/uL    LYMPHOCYTE # 1.60 1.00 - 3.00 x10^3/uL    MONOCYTE # 0.50 0.30 - 1.00 x10^3/uL    EOSINOPHIL # 0.20 0.00 - 0.50 x10^3/uL    BASOPHIL # 0.00 0.00 - 0.10 x10^3/uL        Imaging:    Results for orders placed or performed during the hospital encounter of 02/12/23   CT LUMBAR SPINE WO IV CONTRAST     Status: None    Narrative    Dominique Carrillo    RADIOLOGIST: Wynema Birch, MD    CT LUMBAR SPINE WO IV CONTRAST performed on 02/12/2023 10:32 AM    CLINICAL HISTORY: pain after fall, left buttock/lower back.  pain after fall on Thursday, left buttock/lower back, no previous surgeries    TECHNIQUE:  Lumbar spine CT without contrast    COMPARISON: None.    FINDINGS:  Vertebrae:  Vertebral body heights are maintained.    Alignment:  No spondylolisthesis.    There are mild diffuse degenerative changes. There likely are areas of mild central canal stenosis.    Sacrum:  There is an acute nondisplaced fracture through the left aspect of the sacral body. This is obliquely oriented. There is no widening of the sacroiliac joints.    There is presacral edema. There is dependent atelectasis in the lung bases.        Impression    THERE IS NO ACUTE LUMBAR SPINE FRACTURE. THERE IS AN ACUTE NONDISPLACED FRACTURE TO THE LEFT SACRUM.      One or more dose reduction techniques were used (e.g., Automated exposure control, adjustment of the mA and/or kV according to patient size, use of iterative reconstruction technique).      Radiologist location ID: [id number]     CT ABDOMEN PELVIS WO IV CONTRAST     Status: None    Narrative    Dominique Carrillo    RADIOLOGIST: Lucien Mons    CT ABDOMEN PELVIS WO IV CONTRAST performed on 02/12/2023 10:32 AM    CLINICAL HISTORY: fall, pain in right hip.  fall on Thursday, pain in right hip    TECHNIQUE:  Abdomen and pelvis CT without intravenous  contrast.    COMPARISON:  None.    FINDINGS:  Noncontrast technique limits evaluation of the abdominal and pelvic viscera.    Lung bases: Clear    Liver:   Unremarkable.    Gallbladder:   Unremarkable.    Spleen:   Unremarkable.    Pancreas:   Unremarkable.    Adrenals:   Unremarkable.    Kidneys:   Unremarkable.    Bladder:  Unremarkable.    Uterus and Adnexa:  Unremarkable.    Bowel:   Unremarkable.    Appendix:  Normal.    Lymph nodes:  No suspicious lymph node enlargement.    Vasculature:   Major vascular structures are unremarkable.     Peritoneum / Retroperitoneum: No ascites.  No free air.    Bones:   There is a nondisplaced fracture of the left sacral ala.          Impression    Nondisplaced left sacral ala fracture       One or more dose reduction techniques were used (e.g., Automated exposure control, adjustment of the mA and/or kV according to patient size, use of iterative reconstruction technique).      Radiologist location ID: [id number]           Microbiology:  No results found for any visits on 02/12/23 (from the past 96 hour(s)).       Assessment/ Plan:   Sacral fracture  -sacral film shows nondisplaced sacral fracture  -PT/OT as per schedule  -conservative pain management strategy in place    Hypothyroidism  -TFTs reviewed  -Hormone supplementation Rx to continue     GERD  -PPI Rx to continue     Dyslipidemia  -statin Rx to continue   -stable; no active issues     Code Status:  Full code    Diet:  Regular    DVT Prophylaxis   -SCDs/Lovenox    Disposition:  Pending clinical course    I have spent 55 minutes care for this patient with over half in discussion of the diagnosis and the importance of compliance with the treatment plan.  The remainder of the time was spent examining the patient and discussing the plan of  care patient's family/care team    Lalla Brothers, MD    This note was partially generated using MModal Fluency Direct system, and there may be some incorrect words, spellings, and  punctuation that were not noted in checking the note before saving.

## 2023-02-14 NOTE — Care Management Notes (Signed)
[facility]  Care Management Initial Evaluation    Patient Name: Dominique Carrillo  Date of Birth: August 06, 1965  Sex: female  Date/Time of Admission: 02/12/2023  9:43 AM  Room/Bed: 375/A  Payor: Layla Maw MEDICAID / Plan: Layla Maw [place] MEDICAID / Product Type: Medicaid MC /   Primary Care Providers:  Darlyne Russian, DO (General)    Pharmacy Info:   Preferred Pharmacy       [facility] [id number] - [place], [place] - [address] AT [facility] [address]    [address] [place] [address]    Phone: [phone] Fax: [phone]    Hours: Not open 24 hours          Emergency Contact Info:   No emergency contact information on file.    History:   Dominique Carrillo is a 57 y.o., female, admitted 02/12/23.    Height/Weight: 170.2 cm (5\' 7" ) / 97.5 kg (215 lb)     LOS: 2 days   Admitting Diagnosis: Sacral fracture (CMS HCC) [S32.10XA]    Assessment:      02/14/23 1809   Assessment Details   Assessment Type Admission   Date of Care Management Update 02/14/23   Readmission   Is this a readmission? No   Insurance Information/Type   Insurance type Medicaid   Employment/Financial   Patient has Prescription Coverage?  Yes        Name of Insurance Coverage for Medications Unicare Medicaid   Financial Concerns none   Living Environment   Select an age group to open "lives with" row.  Adult   Lives With alone   Living Arrangements mobile home   Able to Return to Prior Arrangements yes   IEP and/or 504 Plan? No   Home Safety   Home Assessment: Stairs in Home   Home Accessibility stairs to enter home   Custody and Legal Status   Do you have a court appointed guardian/conservator? No   Are you an emancipated minor? No   Custody Issues? No   Paternity Affidavit Requested? No   Care Management Plan   Discharge Planning Status initial meeting   Projected Discharge Date 02/15/23   Discharge plan discussed with: Patient   CM will evaluate for rehabilitation potential yes   Patient choice offered to patient/family yes    Facility or Agency Preferences [facility]   Discharge Needs Assessment   Outpatient/Agency/Support Group Needs homecare agency   Equipment Currently Used at Home walker, front wheeled;commode;shower chair   Equipment Needed After Discharge none   Discharge Facility/Level of Care Needs Home with Home Health and DME (code 6)   Transportation Available car;family or friend will provide   Referral Information   Arrived From home or self-care   Stairs Within Home, Primary   Number of Stairs, Within Home, Primary none   Home Main Entrance   Number of Stairs, Main Entrance ten     Pt admitted on 02/12/23 with a sacral fracture. CM met with pt at bedside. Pt was alert and oriented to all spheres. Pt lives alone. There are 10 steps to get into pt's home and then it is all one level. Pt has a cane, but no other durable medical equipment. She would like a bedside commode, walker, and shower chair. Pt chose to use [facility]. CM made referral to [facility] and Helmut Muster, liaison for [facility] delivered walker to pt's room. The bedside commode and shower chair will be delivered  to pt's home. Pt will need home health services. She chose to use [facility]. CM placed referral to [facility] in [place]. Pt would like to have her pain controlled before going home. She stated that she has horrific pain upon movement. Dr. Mayford Knife aware and agreed to work on pain regimen for pt.    Discharge Plan:  Home with Home Health and DME (code 6)      The patient will continue to be evaluated for developing discharge needs.     Case Manager: Yaakov Guthrie, [place]  Phone: [phone]

## 2023-02-14 NOTE — OT Evaluation (Signed)
[facility] Medicine [facility]  [address]  [place], [address]  [phone]  (Fax) [phone]  Rehabilitation Services  Occupational Therapy Inpatient Initial Evaluation      Patient Name: Dominique Carrillo  Date of Birth: 1965-07-31  Height: Height: 170.2 cm (5\' 7" )  Weight: Weight: 97.5 kg (215 lb)  Room/Bed: 375/A  Payor: Layla Maw MEDICAID / Plan: [employer] [place] MEDICAID / Product Type: Medicaid MC /         PMH:   Past Medical History:   Diagnosis Date    DVT (deep venous thrombosis) (CMS HCC)     Left Calf    Hypothyroidism     Osteopenia     Tendonitis of wrist, right     Tinnitus of both ears     Vitamin D deficiency            Assessment:      Functional Level at Time of Session: (P) Patient is a pleasant 57 year old female admitted for sacral fracture. Prior to admission, she was independent in ADLs, IADLs and functional mobility. During OT evaluation, patient was alert, oriented x4, cooperative and able to follow multistep commands. She has full UB AROM and good (4/5) UB strength. Patient was dependent in LB dressing due to pain and limited ROM. OT educated patient on use of ADL kit and patient was able to return demo successfully. OT educated patient on bed/car/bathroom transfers and EC/WS strategies. Patient confirmed understanding. Patient would benefit from IPR upon discharge. OT to follow up during acute stay for ADL training, DME/AE training and functional transfer training.    Discharge Needs:   Equipment Recommendation:  BSC, tub bench, reacher, sock aid, long handled sponge, long shoe horn     The patient presents with mobility limitations due to impaired range of motion and weight bearing restrictions that significantly impair/prevent patient's ability to participate in mobility-related activities of daily living (MRADLs) including  ambulation and transfers in order to safely complete, toileting, bathing, food preparation, working, laundering/household tasks, safely  entering/exiting the home, in reasonable time. This functional mobility deficit can be sufficiently resolved with the use of a Anticipated Equipment Needs at Discharge: (P) bathing equipment, dressing equipment, bedside commode, reacher, tub bench  in order to decrease the risk of falls, morbidity, and mortality in performance of these MRADLs.  Patient is able to safely use this assistive device.    Discharge Disposition:  inpatient rehabilitation facility    JUSTIFICATION OF DISCHARGE RECOMMENDATION   Based on current diagnosis, functional performance prior to admission, and current functional performance, this patient requires continued OT services in Anticipated Discharge Disposition: (P) inpatient rehabilitation facility  in order to achieve significant functional improvements.    Plan:   Current Intervention:  Predicted Duration of Therapy: (P) until discharge    To provide Occupational therapy services  Therapy Frequency: (P) minimum of 1x/week for duration of Predicted Duration of Therapy: (P) until discharge  .       The risks/benefits of therapy have been discussed with the patient/caregiver and he/she is in agreement with the established plan of care.       Subjective & Objective     MEDICAL HISTORY:   Past Medical History:   Diagnosis Date    DVT (deep venous thrombosis) (CMS HCC)     Left Calf    Hypothyroidism     Osteopenia     Tendonitis of wrist, right     Tinnitus of both ears  Vitamin D deficiency          SURGICAL HISTORY:   Past Surgical History:   Procedure Laterality Date    COLONOSCOPY  08/27/2018    Dr. Abbe Amsterdam    ESOPHAGOGASTRODUODENOSCOPY  08/27/2018    Dr. Arley Phenix DILATION AND CURETTAGE      URETHRAL DILATION      when 57 years old       ipp    INSERT FLOW SHEET     02/14/23 0934   Rehab Session   Document Type evaluation   OT Visit Date 02/14/23   Total OT Minutes: 27   Patient Effort good   General Information   Patient Profile Reviewed yes   Medical Lines PIV Line;Telemetry    Respiratory Status room air   Existing Precautions/Restrictions fall precautions   Pre Treatment Status   Pre Treatment Patient Status Patient sitting in bedside chair or w/c;Call light within reach;Telephone within reach;Nurse approved session   Support Present Pre Treatment  None   Communication Pre Treatment  Charge Nurse   Communication Pre Treatment Comment Cleared for OT   Living Environment   Lives With alone   Living Arrangements mobile home   Home Accessibility stairs to enter home   Living Environment Comment She has a tub and no DME/AE   Home Main Entrance   Number of Stairs, Main Entrance ten   Functional Level Prior   Ambulation 0 - independent   Transferring 0 - independent   Toileting 0 - independent   Bathing 0 - independent   Dressing 0 - independent   Eating 0 - independent   Communication 0 - understands/communicates without difficulty   Swallowing 0-->swallows foods/liquids without difficulty   Self-Care   Dominant Hand right   Usual Activity Tolerance good   Current Activity Tolerance good   Vital Signs   Vitals Comment Pulse ox disconnected   Coping/Psychosocial   Observed Emotional State calm;cooperative   Verbalized Emotional State acceptance   Coping/Psychosocial Response Interventions   Plan Of Care Reviewed With patient   Cognitive Assessment/Interventions   Behavior/Mood Observations behavior appropriate to situation, WNL/WFL   Orientation Status oriented x 4   Attention WNL/WFL   Follows Commands WFL   Vision Assessment/Interventions   Visual Impairment/Limitations WFL   RUE Assessment   RUE Assessment WFL- Within Functional Limits   LUE Assessment   LUE Assessment WFL- Within Functional Limits   Grip Strength   Grip Left (4/5) good, left   Right Grip (4/5) good, right   Lower Body Dressing Assessment/Training   Position sitting   DRESSING ASSESSED Don Socks;Doff Socks   ASSISTANCE REQUIRED DONNING Right sock;Left sock   ASSITANCE REQUIRED DOFFING  Right sock;Left sock   Independence  Level  dependent (less than 25% patient effort)   Impairments pain;ROM decreased   Post Treatment Status   Post Treatment Patient Status Patient sitting in bedside chair or w/c;Call light within reach;Telephone within reach;Patient safety alarm activated   Support Present Post Treatment  None   Care Plan Goals   OT Rehab Goals Bathing Goal;LB Dressing Goal;Toileting Goal   Bathing Goal   Bathing Goal, Date Established 02/14/23   Bathing Goal, Time to Achieve by discharge   Bathing Goal, Activity Type all bathing tasks   Bathing Goal, Independence Level minimum assist (75% patient effort)   Bathing Goal, Adaptive Equipment sponge, long handled   LB Dressing Goal   LB Dressing Goal, Date Established 02/14/23  LB Dressing Goal, Time to Achieve by discharge   LB Dressing Goal, Activity Type all lower body dressing tasks   LB Dressing Goal, Independence Level minimum assist (75% patient effort)   LB Dressing Goal, Adaptive Equipment shoe horn, long handled;sock-aid;reacher   Toileting Goal   Toileting Goal, Date Established 02/14/23   Toileting Goal, Time to Achieve by discharge   Toileting Goal, Activity Type all toileting tasks   Toileting Goal, Independence Level minimum assist (75% patient effort)   Clinical Impression   Functional Level at Time of Session Patient is a pleasant 57 year old female admitted for sacral fracture. Prior to admission, she was independent in ADLs, IADLs and functional mobility. During OT evaluation, patient was alert, oriented x4, cooperative and able to follow multistep commands. She has full UB AROM and good (4/5) UB strength. Patient was dependent in LB dressing due to pain and limited ROM. OT educated patient on use of ADL kit and patient was able to return demo successfully. OT educated patient on bed/car/bathroom transfers and EC/WS strategies. Patient confirmed understanding. Patient would benefit from IPR upon discharge. OT to follow up during acute stay for ADL training, DME/AE  training and functional transfer training.   Criteria for Skilled Therapeutic Interventions Met (OT) yes;meets criteria;skilled treatment is necessary   Rehab Potential good   Therapy Frequency minimum of 1x/week   Predicted Duration of Therapy until discharge   Anticipated Equipment Needs at Discharge bathing equipment;dressing equipment;bedside commode;reacher;tub bench   Anticipated Discharge Disposition inpatient rehabilitation facility   Evaluation Complexity Justification   Occupational Profile Review Brief history   Performance Deficits 1-3 deficits   Clinical Decision Making Low analytic complexity   Evaluation Complexity Low       TREATMENT PLAN: THERAPEUTIC ACTIVITIES, ADL/IADL TRAINING, and DME/AE TRAINING  EVALUATION COMPLEXITY: CLINICAL DECISION MAKING OF LOW COMPLEXITY AS INDICATED BY PMH, OCCUPATIONAL THERAPY ASSESSMENT OF MUSCULOSKELETAL AND NEUROLOGICAL SYSTEMS AND ACTIVITY LIMITATIONS. CLINICAL PRESENTATION IS STABLE AND UNCOMPLICATED.      EVALUATION 10 minutes and ADL/IADL TRAINING 17 minutes    Therapist:      Hildred Laser, OT,02/14/2023 12:32

## 2023-02-15 DIAGNOSIS — R52 Pain, unspecified: Secondary | ICD-10-CM

## 2023-02-15 DIAGNOSIS — R262 Difficulty in walking, not elsewhere classified: Secondary | ICD-10-CM

## 2023-02-15 LAB — COMPREHENSIVE METABOLIC PANEL, NON-FASTING
ALBUMIN/GLOBULIN RATIO: 1.5 — ABNORMAL HIGH (ref 0.8–1.4)
ALBUMIN: 3.4 g/dL — ABNORMAL LOW (ref 3.5–5.7)
ALKALINE PHOSPHATASE: 67 U/L (ref 34–104)
ALT (SGPT): 16 U/L (ref 7–52)
ANION GAP: 7 mmol/L (ref 4–13)
AST (SGOT): 11 U/L — ABNORMAL LOW (ref 13–39)
BILIRUBIN TOTAL: 0.6 mg/dL (ref 0.3–1.0)
BUN/CREA RATIO: 21 (ref 6–22)
BUN: 16 mg/dL (ref 7–25)
CALCIUM, CORRECTED: 9.2 mg/dL (ref 8.9–10.8)
CALCIUM: 8.7 mg/dL (ref 8.6–10.3)
CHLORIDE: 104 mmol/L (ref 98–107)
CO2 TOTAL: 28 mmol/L (ref 21–31)
CREATININE: 0.77 mg/dL (ref 0.60–1.30)
ESTIMATED GFR: 91 mL/min/{1.73_m2} (ref >59)
GLOBULIN: 2.2 — ABNORMAL LOW (ref 2.9–5.4)
GLUCOSE: 113 mg/dL — ABNORMAL HIGH (ref 74–109)
OSMOLALITY, CALCULATED: 280 mOsm/kg (ref 270–290)
POTASSIUM: 3.9 mmol/L (ref 3.5–5.1)
PROTEIN TOTAL: 5.6 g/dL — ABNORMAL LOW (ref 6.4–8.9)
SODIUM: 139 mmol/L (ref 136–145)

## 2023-02-15 LAB — CBC WITH DIFF
BASOPHIL #: 0 10*3/uL (ref 0.00–0.10)
BASOPHIL %: 1 % (ref 0–1)
EOSINOPHIL #: 0.2 10*3/uL (ref 0.00–0.50)
EOSINOPHIL %: 3 % (ref 1–7)
HCT: 33.1 % (ref 31.2–41.9)
HGB: 11.4 g/dL (ref 10.9–14.3)
LYMPHOCYTE #: 1.3 10*3/uL (ref 1.00–3.00)
LYMPHOCYTE %: 25 % (ref 16–44)
MCH: 31 pg (ref 24.7–32.8)
MCHC: 34.5 g/dL (ref 32.3–35.6)
MCV: 89.6 fL (ref 75.5–95.3)
MONOCYTE #: 0.4 10*3/uL (ref 0.30–1.00)
MONOCYTE %: 8 % (ref 5–13)
MPV: 7.3 fL — ABNORMAL LOW (ref 7.9–10.8)
NEUTROPHIL #: 3.3 10*3/uL (ref 1.85–7.80)
NEUTROPHIL %: 64 % (ref 43–77)
PLATELETS: 220 10*3/uL (ref 140–440)
RBC: 3.69 10*6/uL (ref 3.63–4.92)
RDW: 13.1 % (ref 12.3–17.7)
WBC: 5.2 10*3/uL (ref 3.8–11.8)

## 2023-02-15 LAB — MAGNESIUM: MAGNESIUM: 2 mg/dL (ref 1.9–2.7)

## 2023-02-15 MED ORDER — HYDROCODONE 5 MG-ACETAMINOPHEN 325 MG TABLET
1.0000 | ORAL_TABLET | Freq: Three times a day (TID) | ORAL | 0 refills | Status: DC | PRN
Start: 2023-02-15 — End: 2023-05-03

## 2023-02-15 MED ORDER — LIDOCAINE 4 % TOPICAL PATCH
2.0000 | MEDICATED_PATCH | Freq: Every day | CUTANEOUS | 0 refills | Status: AC
Start: 2023-02-16 — End: 2023-03-18

## 2023-02-15 NOTE — Nurses Notes (Signed)
Saline lock removed, catheter noted to be intact, and pressure held to site. Verbalizes understanding the importance of following all discharge instructions and returning for any follow up appointments. Patient discharged in possession of all personal belongings, including discharge packet and patient education materials. Escorted to family vehicle via wheelchair by staff.

## 2023-02-15 NOTE — OT Treatment (Signed)
[facility] Medicine [facility]  [address]  [place], [address]  [phone]  (Fax) [phone]  Rehabilitation Department     Occupational Therapy Daily Inpatient Note    Date: 02/15/2023  Patient's Name: Dominique Carrillo  Date of Birth: 1965-09-05  Height: Height: 170.2 cm (5\' 7" )  Weight: Weight: 97.5 kg (215 lb)        Plan: Will continue under current POC.         Subjective/Objective/Assessment:      02/15/23 1057   Rehab Session   Document Type therapy progress note (daily note)   OT Visit Date 02/15/23   Total OT Minutes: 23   Patient Effort excellent   Symptoms Noted During/After Treatment increased pain   General Information   Patient Profile Reviewed yes   Medical Lines Telemetry;PIV Line   Respiratory Status room air   Pre Treatment Status   Pre Treatment Patient Status Patient sitting on edge of bed;Call light within reach;Nurse approved session   Support Present Pre Treatment  None   Communication Pre Treatment  Charge Nurse   Communication Pre Treatment Comment Clear for OT   Vital Signs   Vitals Comment patient not connected to pulse ox   Pain Assessment   Pretreatment Pain Rating 3/10   Posttreatment Pain Rating 3/10   Pre/Posttreatment Pain Comment pain with postioning and moveing/sitting.   Cognitive Assessment/Interventions   Behavior/Mood Observations behavior appropriate to situation, WNL/WFL   Orientation Status oriented x 4   Bed Mobility Assessment/Treatment   Supine-Sit Independence stand-by assistance   Sit to Supine, Independence modified independence   Transfer Assessment/Treatment   Sit-Stand Independence stand-by assistance   Stand-Sit Independence stand-by assistance   Sit-Stand-Sit, Assist Device walker, front wheeled   Lower Body Dressing Assessment/Training   Assistive Devices long-handled shoe horn;reacher;sock-aid   DRESSING ASSESSED Deanne Coffer Socks;Doff Underwear;Doff Socks   Toileting Assessment/Training   Assistive Devices bedside commode    Position sitting;standing   TOILETING ASSESSED Adjust clothing prior;Adjust clothing after;Perineal hygiene   Post Treatment Status   Post Treatment Patient Status Patient supine in bed;Call light within reach;Patient safety alarm activated   Support Present Post Treatment  None   Clinical Impression   Functional Level at Time of Session Patient able to recall use and proper use of ADL kit. patient able to demonstrate how to don/doff socks and undewearing using reacher and sock aid. patient able to complete with extra time. patient completed bed mobility and sit to stand trasnfers and stand sit with use of FWW and standby A from COTA. patient completed toileting and hand hygiene  with standbyA. patient left supine in  bed with alarm on and needs within reach.                 Intervention minutes: ADL/IADL TRAINING 23 minutes      THERAPIST  Christ Kick, COTA  02/15/2023, 13:23

## 2023-02-15 NOTE — Discharge Summary (Signed)
[facility]  DISCHARGE SUMMARY    PATIENT NAME:  Dominique Carrillo, Dominique Carrillo  MRN:  [id number]  DOB:  05-06-1966    ENCOUNTER DATE:  02/12/2023  INPATIENT ADMISSION DATE: 02/12/2023  DISCHARGE DATE:  02/15/2023    ATTENDING PHYSICIAN: Lalla Brothers, MD  SERVICE: PRN HOSPITALIST 1  PRIMARY CARE PHYSICIAN: Mickey Farber, DO       No lay caregiver identified.    PRIMARY DISCHARGE DIAGNOSIS: Fracture of sacrum (CMS [facility])  Active Hospital Problems    Diagnosis Date Noted    Principal Problem: Fracture of sacrum (CMS HCC) [S32.10XA] 02/12/2023    Severe pain [R52] 02/12/2023    Vitamin D deficiency [E55.9] 03/22/2022    Hypothyroidism [E03.9] 09/13/2021    Osteoporosis [M81.0] 09/13/2021      Resolved Hospital Problems   No resolved problems to display.     Active Non-Hospital Problems    Diagnosis Date Noted    Need for hepatitis C screening test 09/29/2022    Chronic right hip pain 09/29/2022    Pigmented skin lesion of uncertain behavior of upper extremity 09/29/2022    Hypokalemia 05/31/2022    Fatigue 03/22/2022    Obesity 09/13/2021    Pre-diabetes 09/13/2021    Hair loss 09/13/2021    Low back pain 09/13/2021    Onychomycosis 09/13/2021    Mastodynia 09/13/2021    Carpal tunnel syndrome of right wrist 09/13/2021    Brachial plexus lesions 09/13/2021    Acid reflux 09/13/2021    Essential hypertension 09/13/2021    Hyperlipidemia 09/13/2021           Current Discharge Medication List        START taking these medications.        Details   HYDROcodone-acetaminophen 5-325 mg Tablet  Commonly known as: NORCO   1 Tablet, Oral, EVERY 8 HOURS PRN  Qty: 24 Tablet  Refills: 0     lidocaine 4 % Adhesive Patch, Medicated  Start taking on: February 16, 2023   2 Patches, Transdermal, DAILY  Qty: 60 Patch  Refills: 0            CONTINUE these medications - NO CHANGES were made during your visit.        Details   aspirin 81 mg Tablet, Chewable   81 mg, Oral, DAILY  Refills: 0     CALCIUM 26-VIT D3-MAGNESIUM 15 ORAL   2 Tablets,  Oral, DAILY, 2 gummies daily  Refills: 0     ergocalciferol (vitamin D2) 1,250 mcg (50,000 unit) Capsule  Commonly known as: DRISDOL   50,000 Units, Oral, EVERY 7 DAYS  Qty: 12 Capsule  Refills: 0     famotidine 40 mg Tablet  Commonly known as: PEPCID   40 mg, Oral, NIGHTLY  Qty: 90 Tablet  Refills: 0     hydrOXYzine pamoate 50 mg Capsule  Commonly known as: VISTARIL   TAKE 1 CAPSULE BY MOUTH TWICE A DAY AS NEEDED  Qty: 60 Capsule  Refills: 1     levothyroxine 75 mcg Tablet  Commonly known as: SYNTHROID   75 mcg, Oral, EVERY MORNING  Qty: 90 Tablet  Refills: 1     pantoprazole 40 mg Tablet, Delayed Release (E.C.)  Commonly known as: PROTONIX   40 mg, Oral, DAILY  Qty: 90 Tablet  Refills: 1     rosuvastatin 10 mg Tablet  Commonly known as: CRESTOR   10 mg, Oral, DAILY  Qty: 90 Tablet  Refills: 1  STOP taking these medications.      predniSONE 20 mg Tablet  Commonly known as: DELTASONE            Discharge med list refreshed?  YES     Allergies   Allergen Reactions    Amoxicillin  Other Adverse Reaction (Add comment)     Mouth Blisters/Swelling    Augmentin [Amoxicillin-Pot Clavulanate]      Blisters in mouth    Propoxyphene Nausea/ Vomiting     HOSPITAL PROCEDURE(S):   No orders of the defined types were placed in this encounter.      REASON FOR HOSPITALIZATION AND HOSPITAL COURSE   BRIEF HPI:  This is a 57 y.o., female admitted for sacral fracture      BRIEF HOSPITAL NARRATIVE:   It was elected to admit the patient was evaluation sacrum fracture in the setting of difficulty ambulation.  The patient had a plain film which showed nondisplaced sacral fracture and the patient responded well to supportive care and conservative pain management strategy.  She was evaluated by PT/occupational team while she was admitted and it was able to participate in PT OT related activities without difficulty.  The case management team assisted with arranging for outpatient physical therapy as it was thought that she could  benefit from additional therapy on an outpatient basis.  The patient demonstrated the ability to tolerate PO medications/nutrition reliably and without issue.  She will be discharged home with plans for follow up with PCP for continued management of osteoporosis in addition to the rest of her chronic medical issues.  The care plan was reviewed in detail at the bedside with the patient who endorsed comprehension and was agreeable.      TRANSITION/POST DISCHARGE CARE/PENDING TESTS/REFERRALS:  PCP    CONDITION ON DISCHARGE:  A. Ambulation: Ambulation with assistive device  B. Self-care Ability: Complete  C. Cognitive Status Alert and Oriented x 3  D. Code status at discharge:   Full code      LINES/DRAINS/WOUNDS AT DISCHARGE:   Patient Lines/Drains/Airways Status       Active Line / Dialysis Catheter / Dialysis Graft / Drain / Airway / Wound       Name Placement date Placement time Site Days    Peripheral IV Left;Posterior Dorsal Metacarpals  (top of hand) 02/12/23  0946  -- 3                    DISCHARGE DISPOSITION:  Home discharge  DISCHARGE INSTRUCTIONS:  Post-Discharge Follow Up Appointments       Wednesday Feb 22, 2023    Hospital Follow Up with Ignacia Marvel, APRN,NP-C at  1:45 PM    Follow up with Mickey Farber, DO    Phone: [phone]    Where: [facility]      Monday Apr 03, 2023    Return Patient Visit with Mickey Farber, DO at  4:30 PM      Family Medicine, Medical Office Building  [facility], [place]  [address]  [place] [address]  [phone]             Refer to Home Health - EXTERNAL     DME - BEDSIDE COMMODE    A standard commode is covered when the patient is physically incapable of utilizing regular toilet facilities.  An extra wide/heavy duty commode chair is covered for a patient who weighs 300 pounds or more.     I certify that a bedside  commode is necessary due to Patient is confined to a single room    Bedside Commode Type Standard Bedside  Commode    Freedom of Choice: I have informed patient of their freedom of choice with respect to DME providers    Estimated Length of Need (in months; 99 mo.= lifetime) 99      DME - WALKER Front Wheeled    Please note - If patient is 300 lbs or greater please order bariatric or heavy duty items.     Ht 170.2 cm    Wt 97.52 kg    Current Attending: Mayford Knife    Medical Condition or Diagnosis which is primary reason for equipment: sacral fracture    Patient has mobility limits that significantly impairs ability to participate in one or more mobility related ADL's (MRADL's): Yes    Moblity Limitations: Pt at heightened risk of injury r/t attempts to fulfill MRADL's & can safely use walker which resolves issue    Walker Type: Front Wheeled    Freedom of Choice: I have informed patient of their freedom of choice with respect to DME providers    Estimated Length of Need (in months; 99 mo.= lifetime) 99      DME,OTHER (REQUISITION/REQUEST)    Shower chair     Freedom of Choice: I have informed patient of their freedom of choice with respect to DME providers    Estimated Length of Need (in months; 99 mo.= lifetime) 99           Lalla Brothers, MD    Copies sent to Care Team         Relationship Specialty Notifications Start End    Mickey Farber, [place] PCP - General FAMILY MEDICINE Admissions 05/30/22     Phone: [phone] Fax: [phone]         [address] [place] [address]            Referring providers can utilize [email or web address] to access their referred [facility] patient's information.

## 2023-02-15 NOTE — PT Treatment (Signed)
[facility]  [address]  [place], [address]  [phone]  (Fax) [phone]  Rehabilitation Department  Physical Therapy Daily Inpatient Note    Date: 02/15/2023  Patient's Name: Dominique Carrillo  Date of Birth: May 01, 1966  Height: Height: 170.2 cm (5\' 7" )  Weight: Weight: 97.5 kg (215 lb)      Plan: Will continue under current POC.     Discharge Recommendation: Home with Home Health        Subjective/Objective/Assessment:  Flowsheet    02/15/23 1015   Rehab Session   Document Type therapy progress note (daily note)   PT Visit Date 02/15/23   General Information   Patient Profile Reviewed yes   Medical Lines PIV Line;Telemetry   Respiratory Status room air   Existing Precautions/Restrictions fall precautions   Pre Treatment Status   Pre Treatment Patient Status Patient sitting in bedside chair or w/c   Support Present Pre Treatment  None   Communication Pre Treatment  Charge Nurse   Communication Pre Treatment Comment cleared for PT   Pre-Treatment Pain   Pretreatment Pain Rating 4/10   Pre/Posttreatment Pain Comment sacral pain   Bed Mobility Assessment/Treatment   Supine-Sit Independence modified independence   Sit to Supine, Independence modified independence   Bed Mobility, Assistive Device bed rails   Transfer Assessment/Treatment   Sit-Stand Independence stand-by assistance   Stand-Sit Independence stand-by assistance   Sit-Stand-Sit, Assist Device walker, front wheeled   Transfer Impairments pain   Gait Assessment/Treatment   Total Distance Ambulated 340   Independence  stand-by assistance   Impairments  pain   Balance   Sitting Balance: Static good balance   Sitting, Dynamic (Balance) good balance   Sit-to-Stand Balance good balance   Standing Balance: Static good balance   Post Treatment Status   Post Treatment Patient Status Patient sitting in bedside chair or w/c;Patient safety alarm activated   Support Present Post Treatment  None   Patient Effort good    Post-Treatment Pain   Posttreatment Pain Rating 3/10   Cognitive Assessment/Intervention   Behavior/Mood Observations behavior appropriate to situation, WNL/WFL   Physical Therapy Time and Intention   Total PT Minutes: 24     Patient transferred in and oob modified independent, standing with sba,  gaited with rw 361ft, over all steps improving, back in the bed with need in reach, bed alarmed            Intervention minutes: GAIT TRAINING 24 MINUTES    THERAPIST  Lane Hacker, PTA  02/15/2023, 12:37

## 2023-02-16 ENCOUNTER — Telehealth (HOSPITAL_COMMUNITY): Payer: Self-pay | Admitting: Family Medicine

## 2023-02-16 ENCOUNTER — Telehealth (INDEPENDENT_AMBULATORY_CARE_PROVIDER_SITE_OTHER): Payer: Self-pay | Admitting: Family Medicine

## 2023-02-16 NOTE — Care Management Notes (Signed)
Referral Information  ++++++ Placed Provider #1 ++++++  Case Manager: Robin Pruitt  Provider Type: Home Health  Provider Name: [facility]  Address:  [address]  [place], [place] [id number]  Contact: Missy McGuire    Phone: [phone] x  Fax:   Fax: [phone]

## 2023-02-16 NOTE — Telephone Encounter (Signed)
Spoke with patient. She voiced understanding.       She has an appointment on Wednesday.

## 2023-02-16 NOTE — Telephone Encounter (Signed)
Transition of Care Contact Information  Discharge Date: 02/15/2023  Transition Facility Type--Hospital (Inpatient or Observation)  Facility Name--[facility]  Interactive Contact(s): Completed or attempted contact indicated by Date/Time  Completed Contact: 02/16/2023 10:12 AM  First Attempt Call: 02/16/2023 10:10 AM  Contact Method(s)-- Patient/Caregiver Telephone  Clinical Staff Name/Role who contacted--Calysta Craigo Peggye Form, BSN, RN  Transition Assessment  Discharge Summary obtained?--Yes  How are you recovering?--Improving  Discharge Meds obtained?--Yes  Discharge medication changes reviewed?--Yes  Full Medication Reconciliation Completed?--No  Medication understanding --knows new medication(s)--knows purpose of medication  Medication Concerns?--No  Have everything needed for recovery?--Yes  Care Coordination:   Patient has transition follow-up appointment date and time?  Primary Care Transition Visit planned?--Yes  Specialist Transition Visit planned?  Patient/caregiver plans to attend transition visit?--Yes  Primary Follow-up Barrier  Interventions provided --reinforced discharge instructions--follow-up appointment date/time reinforced--med list reviewed with patient--patient expresses understanding of follow-up plan  Home Health or DME ordered at discharge?--Yes  Agency has contacted patient to initiate services?--Yes  Home Health or DME Agency:--[facility]; [facility]: BSC & shower chair  Clinician/Team notified?--No  Primary reason clinician notified?  Transition Note:Plan:      Patient requests transportation referral for follow up appointment? No  Instructions:     Reviewed After Visit Summer[place] (AVS) and discharge instructions as outlined on AVS. Yes  Agrees to contact PCP for non-acute symptoms during the hours of 8 a.m. - 4 p.m., PCP number provided/reinforced. Yes  Nurse Navigator toll free number, resources available, and 24/7 hours of operation provided to patient and/or caregiver.  Yes    Referrals to population health? No    Additional information/assessment:  NN provided care advice for pain control, including medication, -- discussed use of Acetaminophen OTC in addition to Norco  as long as provider did not tell patient she could not take it; patient states no.  NN provided education to patient not to take any more than 4000 mg Acetaminophen daily. Patient states she needs a bench shower chair instead of a regular shower chair; she had already discussed this with [facility]. Patient verbalizes understanding of instructions, information, education and has no further questions.         Dreama Saa, RN    [image]      Walker was delivered to patient's room.

## 2023-02-16 NOTE — Telephone Encounter (Signed)
Patient called and states that she was released from the hospital. She states that they did not give her a work excuse and she fractured her sacrum.     She states that her PTO runs out on Sunday and will need a work excuse past that date due to not being able to work with a fractured sacrum.     Please advise.

## 2023-02-16 NOTE — Care Management Notes (Signed)
Referral Information  ++++++ Placed Provider #1 ++++++  Case Manager: Robin Pruitt  Provider Type: DME  Provider Name: [facility]  Address:  [address]  [place], [place] [id number]  Contact: [place] [facility]    Phone: [phone] x  Fax:   Fax: [phone]

## 2023-02-17 ENCOUNTER — Telehealth (INDEPENDENT_AMBULATORY_CARE_PROVIDER_SITE_OTHER): Payer: Self-pay | Admitting: Family Medicine

## 2023-02-17 DIAGNOSIS — S3210XA Unspecified fracture of sacrum, initial encounter for closed fracture: Secondary | ICD-10-CM

## 2023-02-22 ENCOUNTER — Encounter (INDEPENDENT_AMBULATORY_CARE_PROVIDER_SITE_OTHER): Payer: Self-pay

## 2023-02-22 ENCOUNTER — Other Ambulatory Visit: Payer: Self-pay

## 2023-02-22 ENCOUNTER — Ambulatory Visit (INDEPENDENT_AMBULATORY_CARE_PROVIDER_SITE_OTHER): Payer: Medicaid Other

## 2023-02-22 VITALS — BP 138/87 | HR 65 | Temp 97.0°F | Resp 18 | Ht 67.0 in | Wt 245.6 lb

## 2023-02-22 DIAGNOSIS — S3210XA Unspecified fracture of sacrum, initial encounter for closed fracture: Secondary | ICD-10-CM

## 2023-02-22 DIAGNOSIS — Z09 Encounter for follow-up examination after completed treatment for conditions other than malignant neoplasm: Secondary | ICD-10-CM

## 2023-02-22 NOTE — Progress Notes (Signed)
FAMILY MEDICINE, MEDICAL OFFICE BUILDING  [address]  [place] [address]    Transitional Care Management Note    Name: Dominique Carrillo MRN:  [id number]   Date: 02/22/2023 Age: 57 y.o.     Chief Complaint: Hospital Follow Up (Discharged from hospital due to sacral fracture.) and Hospital Discharge Transition     SUBJECTIVE:  Dominique Carrillo is a 57 y.o. female presenting today for follow-up after being discharged. The main problem requiring admission was sacral fracture.   Left hip pain 08/08 s/p fall. Xray imaging of left knee, bilateral hips and sacrum were all negative. She returned to the ED 08/11 CT imaging revealed a nondisplaced fracture of left sacral ala. Prescribed Norco and lidocaine patches. She has also been using Ibuprofen and Tylenol. Has received her Bedside commode and wheeled walker. Referral was placed with home health for skilled nursing and physical therapy.     OBJECTIVE:   BP 138/87 (Site: Right Arm, Patient Position: Sitting, Cuff Size: Adult)   Pulse 65   Temp 36.1 C (97 F) (Temporal)   Resp 18   Ht 1.702 m (5\' 7" )   Wt 111 kg (245 lb 9.6 oz)   LMP  (LMP Unknown)   SpO2 96%   BMI 38.47 kg/m     Physical Examination:  Physical Exam  Vitals reviewed.   Constitutional:       General: She is not in acute distress.     Appearance: Normal appearance. She is obese.   Cardiovascular:      Rate and Rhythm: Normal rate and regular rhythm.      Pulses: Normal pulses.      Heart sounds: Normal heart sounds.   Pulmonary:      Effort: Pulmonary effort is normal.      Breath sounds: Normal breath sounds.   Abdominal:      General: Abdomen is protuberant. Bowel sounds are normal.      Palpations: Abdomen is soft.   Musculoskeletal:         General: Tenderness (Nondisplaced fracture of left sacral ala) present.   Skin:     General: Skin is warm and dry.      Capillary Refill: Capillary refill takes less than 2 seconds.   Neurological:      General: No focal deficit present.      Mental Status:  She is alert and oriented to person, place, and time. Mental status is at baseline.      Gait: Gait abnormal (Shuffling gait with wheeled walker).   Psychiatric:         Mood and Affect: Mood normal.         Behavior: Behavior normal.         Thought Content: Thought content normal.        Transition of Care Contact Information  Discharge date: Discharge Date: 02/15/2023  Transition Facility Type--Hospital (Inpatient or Observation)  Facility Name--[facility] Interactive Contact(s):  Completed Contact: 02/17/2023 10:19 AM  First Attempt Call: 02/16/2023 10:10 AM  Contact Method(s)-- Patient/Caregiver Telephone  Clinical Staff Name/Role who Shona Needles LPN     Data Reviewed  Medication Reconciliation completed    Assessment and Plan    ICD-10-CM    1. Hospital discharge follow-up  Z09       2. Fracture of sacrum (CMS HCC)  S32.10XA          Other transition actions (Optional) -: Discharge documentation was reviewed, No pending tests or treatments.,  Education about medication(s) provided to patient/ family/ caregiver  , Durable medical equipment ordered at discharge was obtained, and Confirmed that home health agency has contacted patient DME order for shower chair is incompatible with patient's living situation. She has requested that we replace it with a DME order for transfer bench which we have completed. Patient was instructed to continue with previously discussed plan of care and physical therapy.    Return if symptoms worsen or fail to improve.  Ignacia Marvel, [facility]  02/22/2023 14:23

## 2023-02-24 ENCOUNTER — Other Ambulatory Visit (INDEPENDENT_AMBULATORY_CARE_PROVIDER_SITE_OTHER): Payer: Self-pay | Admitting: Family Medicine

## 2023-02-24 DIAGNOSIS — S3210XA Unspecified fracture of sacrum, initial encounter for closed fracture: Secondary | ICD-10-CM

## 2023-02-24 DIAGNOSIS — R52 Pain, unspecified: Secondary | ICD-10-CM

## 2023-02-27 ENCOUNTER — Encounter (INDEPENDENT_AMBULATORY_CARE_PROVIDER_SITE_OTHER): Payer: Medicaid Other | Admitting: Family Medicine

## 2023-02-27 DIAGNOSIS — Z9181 History of falling: Secondary | ICD-10-CM

## 2023-02-27 DIAGNOSIS — Z87891 Personal history of nicotine dependence: Secondary | ICD-10-CM

## 2023-02-27 DIAGNOSIS — E039 Hypothyroidism, unspecified: Secondary | ICD-10-CM

## 2023-02-27 DIAGNOSIS — Z7982 Long term (current) use of aspirin: Secondary | ICD-10-CM

## 2023-02-27 DIAGNOSIS — W19XXXD Unspecified fall, subsequent encounter: Secondary | ICD-10-CM

## 2023-02-27 DIAGNOSIS — M8008XD Age-related osteoporosis with current pathological fracture, vertebra(e), subsequent encounter for fracture with routine healing: Secondary | ICD-10-CM

## 2023-02-27 NOTE — Nursing Note (Signed)
HH Certification and POC reviewed and signed by Dr Jacqulyn Bath. DOS are 02/16/23 to 04/16/23. See scanned.

## 2023-03-01 ENCOUNTER — Encounter (INDEPENDENT_AMBULATORY_CARE_PROVIDER_SITE_OTHER): Payer: Self-pay | Admitting: Family Medicine

## 2023-03-01 DIAGNOSIS — M25551 Pain in right hip: Secondary | ICD-10-CM

## 2023-03-01 DIAGNOSIS — I82409 Acute embolism and thrombosis of unspecified deep veins of unspecified lower extremity: Secondary | ICD-10-CM

## 2023-03-01 NOTE — Nursing Note (Signed)
Patient called stating that she started having pain in her right groin yesterday. She has been having PT and was recommended by the Physical Therapist to have an ultrasound of the right groin and an x-ray to r/o DVT or possible fracture.     Per v/o x-ray of the right hip and ultrasound of the right groin ordered.

## 2023-03-02 ENCOUNTER — Encounter (INDEPENDENT_AMBULATORY_CARE_PROVIDER_SITE_OTHER): Payer: Self-pay | Admitting: Family Medicine

## 2023-03-02 NOTE — Nursing Note (Signed)
Patient called stating that she no longer wants to have the peripheral venous ultrasound states that the groin pain is better/improving thinks that she just over done it. Her appointment wasn't  scheduled until May 05, 2023 and patient wants to return to work before then. She requested that the ultrasound be cancelled.

## 2023-03-08 ENCOUNTER — Other Ambulatory Visit: Payer: Self-pay

## 2023-03-08 ENCOUNTER — Ambulatory Visit
Admission: RE | Admit: 2023-03-08 | Discharge: 2023-03-08 | Disposition: A | Discharge status: Home / Self Care | Payer: Medicaid Other | Source: Ambulatory Visit | Attending: Family Medicine | Admitting: Family Medicine

## 2023-03-08 DIAGNOSIS — M25551 Pain in right hip: Secondary | ICD-10-CM | POA: Insufficient documentation

## 2023-03-19 ENCOUNTER — Other Ambulatory Visit (INDEPENDENT_AMBULATORY_CARE_PROVIDER_SITE_OTHER): Payer: Self-pay | Admitting: Family Medicine

## 2023-04-03 ENCOUNTER — Encounter (INDEPENDENT_AMBULATORY_CARE_PROVIDER_SITE_OTHER): Payer: Medicaid Other | Admitting: Family Medicine

## 2023-04-04 ENCOUNTER — Ambulatory Visit (INDEPENDENT_AMBULATORY_CARE_PROVIDER_SITE_OTHER): Payer: Medicaid Other

## 2023-04-04 ENCOUNTER — Other Ambulatory Visit: Payer: Self-pay

## 2023-04-04 ENCOUNTER — Other Ambulatory Visit: Payer: Medicaid Other | Attending: Family Medicine | Admitting: Family Medicine

## 2023-04-04 DIAGNOSIS — Z1159 Encounter for screening for other viral diseases: Secondary | ICD-10-CM | POA: Insufficient documentation

## 2023-04-04 DIAGNOSIS — I10 Essential (primary) hypertension: Secondary | ICD-10-CM

## 2023-04-04 LAB — MANUAL DIFFERENTIAL
BASOPHIL %: 1 % (ref 0–3)
BASOPHIL ABSOLUTE: 0.04 10*3/uL (ref 0.00–0.30)
BASOPHILS MANUAL: 1
EOSINOPHIL %: 2 % (ref 0–7)
EOSINOPHIL ABSOLUTE: 0.09 10*3/uL (ref 0.00–0.80)
EOSINOPHILS MANUAL: 2
LYMPHOCYTE %: 21 % — ABNORMAL LOW (ref 25–45)
LYMPHOCYTE ABSOLUTE: 0.9 10*3/uL — ABNORMAL LOW (ref 1.10–5.00)
LYMPHOCYTES MANUAL: 21
MONOCYTE %: 7 % (ref 0–12)
MONOCYTE ABSOLUTE: 0.3 10*3/uL (ref 0.00–1.30)
MONOCYTES MANUAL: 7
NEUTROPHIL %: 69 % (ref 40–76)
NEUTROPHIL ABSOLUTE: 2.97 10*3/uL (ref 1.80–8.40)
NEUTROPHILS MANUAL: 69
PLATELET MORPHOLOGY COMMENT: NORMAL
TOTAL CELLS COUNTED [#] IN BLOOD: 100
WBC: 4.3 10*3/uL

## 2023-04-04 LAB — BASIC METABOLIC PANEL
ANION GAP: 8 mmol/L (ref 4–13)
BUN/CREA RATIO: 14 (ref 6–22)
BUN: 13 mg/dL (ref 7–25)
CALCIUM: 9.6 mg/dL (ref 8.6–10.3)
CHLORIDE: 105 mmol/L (ref 98–107)
CO2 TOTAL: 28 mmol/L (ref 21–31)
CREATININE: 0.92 mg/dL (ref 0.60–1.30)
ESTIMATED GFR: 74 mL/min/{1.73_m2} (ref >59)
GLUCOSE: 117 mg/dL — ABNORMAL HIGH (ref 74–109)
OSMOLALITY, CALCULATED: 282 mosm/kg (ref 270–290)
POTASSIUM: 3.7 mmol/L (ref 3.5–5.1)
SODIUM: 141 mmol/L (ref 136–145)

## 2023-04-04 LAB — HEPATIC FUNCTION PANEL
ALBUMIN/GLOBULIN RATIO: 1.4 (ref 0.8–1.4)
ALBUMIN: 4.4 g/dL (ref 3.5–5.7)
ALKALINE PHOSPHATASE: 114 U/L — ABNORMAL HIGH (ref 34–104)
ALT (SGPT): 23 U/L (ref 7–52)
AST (SGOT): 16 U/L (ref 13–39)
BILIRUBIN DIRECT: 0.1 md/dL (ref 0.03–0.18)
BILIRUBIN TOTAL: 0.8 mg/dL (ref 0.3–1.0)
BILIRUBIN, INDIRECT: 0.7 mg/dL (ref <=1)
GLOBULIN: 3.1 (ref 2.9–5.4)
PROTEIN TOTAL: 7.5 g/dL (ref 6.4–8.9)

## 2023-04-04 LAB — LIPID PANEL
CHOL/HDL RATIO: 3.1
CHOLESTEROL: 159 mg/dL (ref <200)
HDL CHOL: 51 mg/dL (ref >=40)
LDL CALC: 77 mg/dL (ref 0–100)
TRIGLYCERIDES: 153 mg/dL — ABNORMAL HIGH (ref <=150)
VLDL CALC: 31 mg/dL (ref 0–50)

## 2023-04-04 LAB — URINALYSIS, MACROSCOPIC
BILIRUBIN: NEGATIVE mg/dL
BLOOD: NEGATIVE mg/dL
GLUCOSE: NEGATIVE mg/dL
KETONES: NEGATIVE mg/dL
LEUKOCYTES: NEGATIVE WBCs/uL
NITRITE: NEGATIVE
PH: 5.5 (ref 5.0–9.0)
PROTEIN: NEGATIVE mg/dL
SPECIFIC GRAVITY: 1.022 (ref 1.002–1.030)
UROBILINOGEN: NORMAL mg/dL

## 2023-04-04 LAB — HIV 1 AND 2 RAPID SCREEN
HIV-1/2 ANTIBODY SCREEN: NONREACTIVE
HIV1-p24 ANTIGEN SCREEN: NONREACTIVE

## 2023-04-04 LAB — CBC WITH DIFF
HCT: 40.6 % (ref 31.2–41.9)
HGB: 13.8 g/dL (ref 10.9–14.3)
MCH: 31.3 pg (ref 24.7–32.8)
MCHC: 33.9 g/dL (ref 32.3–35.6)
MCV: 92.3 fL (ref 75.5–95.3)
MPV: 8.7 fL (ref 7.9–10.8)
PLATELETS: 205 10*3/uL (ref 140–440)
RBC: 4.4 10*6/uL (ref 3.63–4.92)
RDW: 13.9 % (ref 12.3–17.7)
WBC: 4.3 10*3/uL (ref 3.8–11.8)

## 2023-04-04 LAB — URINALYSIS, MICROSCOPIC: WBCS: 1 /[HPF] (ref <6)

## 2023-04-04 LAB — THYROID STIMULATING HORMONE (SENSITIVE TSH): TSH: 2.14 u[IU]/mL (ref 0.450–5.330)

## 2023-04-05 ENCOUNTER — Ambulatory Visit (INDEPENDENT_AMBULATORY_CARE_PROVIDER_SITE_OTHER): Payer: Medicaid Other | Admitting: Family Medicine

## 2023-04-05 ENCOUNTER — Encounter (INDEPENDENT_AMBULATORY_CARE_PROVIDER_SITE_OTHER): Payer: Self-pay

## 2023-04-05 ENCOUNTER — Encounter (INDEPENDENT_AMBULATORY_CARE_PROVIDER_SITE_OTHER): Payer: Self-pay | Admitting: Family Medicine

## 2023-04-05 VITALS — BP 119/77 | HR 66 | Resp 19 | Ht 67.0 in | Wt 247.0 lb

## 2023-04-05 DIAGNOSIS — Z6838 Body mass index (BMI) 38.0-38.9, adult: Secondary | ICD-10-CM

## 2023-04-05 DIAGNOSIS — I10 Essential (primary) hypertension: Secondary | ICD-10-CM

## 2023-04-05 DIAGNOSIS — Z87891 Personal history of nicotine dependence: Secondary | ICD-10-CM

## 2023-04-05 DIAGNOSIS — M810 Age-related osteoporosis without current pathological fracture: Secondary | ICD-10-CM

## 2023-04-05 DIAGNOSIS — E782 Mixed hyperlipidemia: Secondary | ICD-10-CM

## 2023-04-05 DIAGNOSIS — Z1211 Encounter for screening for malignant neoplasm of colon: Secondary | ICD-10-CM | POA: Insufficient documentation

## 2023-04-05 DIAGNOSIS — K219 Gastro-esophageal reflux disease without esophagitis: Secondary | ICD-10-CM

## 2023-04-05 DIAGNOSIS — S3210XG Unspecified fracture of sacrum, subsequent encounter for fracture with delayed healing: Secondary | ICD-10-CM

## 2023-04-05 DIAGNOSIS — E66812 Obesity, class 2: Secondary | ICD-10-CM

## 2023-04-05 DIAGNOSIS — E039 Hypothyroidism, unspecified: Secondary | ICD-10-CM

## 2023-04-05 DIAGNOSIS — M545 Low back pain, unspecified: Secondary | ICD-10-CM

## 2023-04-05 DIAGNOSIS — M533 Sacrococcygeal disorders, not elsewhere classified: Secondary | ICD-10-CM

## 2023-04-05 DIAGNOSIS — R7303 Prediabetes: Secondary | ICD-10-CM

## 2023-04-05 DIAGNOSIS — E559 Vitamin D deficiency, unspecified: Secondary | ICD-10-CM

## 2023-04-05 LAB — HEPATITIS C ANTIBODY SCREEN WITH REFLEX TO HCV PCR: HCV ANTIBODY QUALITATIVE: NEGATIVE

## 2023-04-05 MED ORDER — ROSUVASTATIN 10 MG TABLET
10.0000 mg | ORAL_TABLET | Freq: Every day | ORAL | 1 refills | Status: DC
Start: 2023-04-05 — End: 2023-11-20

## 2023-04-05 MED ORDER — PANTOPRAZOLE 40 MG TABLET,DELAYED RELEASE
40.0000 mg | DELAYED_RELEASE_TABLET | Freq: Two times a day (BID) | ORAL | 1 refills | Status: DC
Start: 2023-04-05 — End: 2024-01-17

## 2023-04-05 MED ORDER — ERGOCALCIFEROL (VITAMIN D2) 1,250 MCG (50,000 UNIT) CAPSULE
50000.0000 [IU] | ORAL_CAPSULE | ORAL | 1 refills | Status: DC
Start: 2023-04-05 — End: 2023-04-05

## 2023-04-05 MED ORDER — LEVOTHYROXINE 75 MCG TABLET
75.0000 ug | ORAL_TABLET | Freq: Every morning | ORAL | 1 refills | Status: DC
Start: 2023-04-05 — End: 2023-10-04

## 2023-04-05 MED ORDER — WEGOVY 0.25 MG/0.5 ML SUBCUTANEOUS PEN INJECTOR
0.2500 mg | PEN_INJECTOR | SUBCUTANEOUS | 1 refills | Status: DC
Start: 2023-04-05 — End: 2023-05-03

## 2023-04-05 MED ORDER — ERGOCALCIFEROL (VITAMIN D2) 1,250 MCG (50,000 UNIT) CAPSULE
50000.0000 [IU] | ORAL_CAPSULE | ORAL | 1 refills | Status: AC
Start: 2023-04-05 — End: 2023-10-02

## 2023-04-05 MED ORDER — PANTOPRAZOLE 40 MG TABLET,DELAYED RELEASE
40.0000 mg | DELAYED_RELEASE_TABLET | Freq: Every day | ORAL | 1 refills | Status: DC
Start: 2023-04-05 — End: 2023-04-05

## 2023-04-05 NOTE — Progress Notes (Signed)
FAMILY MEDICINE, MEDICAL OFFICE BUILDING  [address]  [place] [address]       Name: Dominique Carrillo MRN:  [id number]   Date: 04/05/2023 Age: 57 y.o.          Provider: Mickey Farber, DO    Reason for visit: Follow Up 6 Months      History of Present Illness:  04/05/2023:  This 57 year old female returns for six-month follow-up to review her labs and get refills all her medications.  She is having more more heartburn sporadically she is having physical therapy had a therapy today and will be discharged Monday after her evaluation.  I anticipate she goes back to work Monday week from Monday.  She had been going to physical therapy twice a day week since the 3rd week of August.  She had had an EGD back in 2020 with Dr. Abbe Amsterdam she had gastritis and a hiatal hernia.  There was no evidence of Helicobacter pylori.  Her CBC was normal with a hemoglobin of 13.8 hematocrit 40.6 electrolytes were normal except for glucose of 117 BUN was 13 creatinine 0.92 calcium 9.8 and GFR 74.  Patient's cholesterol was 159 HDL was 51 LDL was 77 triglycerides jumped to 153 as the patient states she has been sitting around not exercising.  Her alkaline phosphatase was elevated at 114 liver enzymes are otherwise normal TSH was 2.140 her hepatitis-C and HIV antibodies were all negative her urine had concentrated with no blood or white cells.  Globulin was normal at 3.1 she denies nausea vomiting diarrhea constipation syncope or near-syncope in his interested in trying [facility]  Historical Data    Past Medical History:  Past Medical History:   Diagnosis Date    DVT (deep venous thrombosis) (CMS HCC)     Left Calf    Hypothyroidism     Osteopenia     Tendonitis of wrist, right     Tinnitus of both ears     Vitamin D deficiency          Past Surgical History:  Past Surgical History:   Procedure Laterality Date    COLONOSCOPY  08/27/2018    Dr. Abbe Amsterdam    ESOPHAGOGASTRODUODENOSCOPY  08/27/2018    Dr. Arley [place]ix DILATION AND CURETTAGE       URETHRAL DILATION      when 57 years old         Allergies:  Allergies   Allergen Reactions    Amoxicillin  Other Adverse Reaction (Add comment)     Mouth Blisters/Swelling    Augmentin [Amoxicillin-Pot Clavulanate]      Blisters in mouth    Propoxyphene Nausea/ Vomiting     Medications:  Current Outpatient Medications   Medication Sig    aspirin 81 mg Oral Tablet, Chewable Chew 1 Tablet (81 mg total) Once a day    CALCIUM 26-VIT D3-MAGNESIUM 15 ORAL Take 2 Tablets by mouth Once a day 2 gummies daily    ergocalciferol, vitamin D2, (DRISDOL) 1,250 mcg (50,000 unit) Oral Capsule Take 1 Capsule (50,000 Units total) by mouth Every 7 days for 180 days    famotidine (PEPCID) 40 mg Oral Tablet TAKE ONE TABLET BY MOUTH EVERY EVENING    HYDROcodone-acetaminophen (NORCO) 5-325 mg Oral Tablet Take 1 Tablet by mouth Every 8 hours as needed for up to 24 doses    hydrOXYzine pamoate (VISTARIL) 50 mg Oral Capsule TAKE 1 CAPSULE BY MOUTH TWICE A DAY AS NEEDED  levothyroxine (SYNTHROID) 75 mcg Oral Tablet Take 1 Tablet (75 mcg total) by mouth Every morning for 180 days    pantoprazole (PROTONIX) 40 mg Oral Tablet, Delayed Release (E.C.) Take 1 Tablet (40 mg total) by mouth Twice daily Indications: gastroesophageal reflux disease, stomach ulcer from aspirin/ibuprofen-like drugs prevention    rosuvastatin (CRESTOR) 10 mg Oral Tablet Take 1 Tablet (10 mg total) by mouth Once a day for 180 days    semaglutide, weight loss, (WEGOVY) 0.25 mg/0.5 mL Subcutaneous Pen Injector Inject 0.5 mL (0.25 mg total) under the skin Every 7 days for 30 days Indications: cardiovascular event risk reduction in obesity, weight loss management for an obese person     Family History:  Family Medical History:       Problem Relation (Age of Onset)    Elevated Lipids Mother, Father, Sister    Heart Attack Mother    Hypertension (High Blood Pressure) Mother, Father, Sister    Hypothyroidism Mother, Father, Sister    No Known Problems Brother, Maternal  Grandmother, Maternal Grandfather, Paternal Grandmother, Paternal Grandfather, Daughter, Son, Maternal Aunt, Maternal Uncle, Paternal Aunt, Paternal Education officer, community, Other            Social History:  Social History     Socioeconomic History    Marital status: Single   Tobacco Use    Smoking status: Former     Current packs/day: 0.00     Types: Cigarettes    Smokeless tobacco: Never   Vaping Use    Vaping status: Never Used   Substance and Sexual Activity    Alcohol use: Yes     Comment: Holiday/special occasion.    Drug use: Never     Social Determinants of Health     Social Connections: Medium Risk (02/12/2023)    Social Connections     SDOH Social Isolation: 3 to 5 times a week           Review of Systems:  Any pertinent Review of Systems as addressed in the HPI above.    Physical Exam:  Vital Signs:  Vitals:    04/05/23 1109   BP: 119/77   Pulse: 66   Resp: 19   SpO2: 98%   Weight: 112 kg (247 lb)   Height: 1.702 m (5\' 7" )   BMI: 38.77     Physical Exam  Vitals and nursing note reviewed.   Constitutional:       General: She is awake.      Appearance: Normal appearance. She is well-developed and well-groomed. She is obese.   HENT:      Head: Normocephalic and atraumatic.      Right Ear: Tympanic membrane, ear canal and external ear normal.      Left Ear: Tympanic membrane, ear canal and external ear normal.      Nose: Nose normal.      Mouth/Throat:      Mouth: Mucous membranes are moist.      Pharynx: Oropharynx is clear.   Eyes:      Extraocular Movements: Extraocular movements intact.      Conjunctiva/sclera: Conjunctivae normal.      Pupils: Pupils are equal, round, and reactive to light.   Cardiovascular:      Rate and Rhythm: Normal rate and regular rhythm.      Pulses:           Carotid pulses are 2+ on the right side and 2+ on the left side.  Radial pulses are 2+ on the right side and 2+ on the left side.        Dorsalis pedis pulses are 2+ on the right side and 2+ on the left side.        Posterior tibial  pulses are 2+ on the right side and 2+ on the left side.      Heart sounds: Normal heart sounds, S1 normal and S2 normal.   Pulmonary:      Effort: Pulmonary effort is normal.      Breath sounds: Normal breath sounds.   Abdominal:      General: Abdomen is protuberant. Bowel sounds are normal.      Palpations: Abdomen is soft.   Musculoskeletal:         General: Normal range of motion.      Cervical back: Normal range of motion and neck supple.      Lumbar back: Spasms, tenderness and bony tenderness present.      Right lower leg: No edema.      Left lower leg: No edema.   Skin:     General: Skin is warm and dry.      Capillary Refill: Capillary refill takes less than 2 seconds.   Neurological:      General: No focal deficit present.      Mental Status: She is alert and oriented to person, place, and time. Mental status is at baseline.      Cranial Nerves: Cranial nerves 2-12 are intact.      Sensory: Sensation is intact.      Motor: Motor function is intact.      Coordination: Coordination is intact.      Gait: Gait abnormal.      Comments: Still has pain when she walks   Psychiatric:         Mood and Affect: Mood normal.         Behavior: Behavior normal. Behavior is cooperative.         Thought Content: Thought content normal.         Judgment: Judgment normal.       Assessment:    ICD-10-CM    1. Class 2 severe obesity due to excess calories with serious comorbidity and body mass index (BMI) of 38.0 to 38.9 in adult (CMS [facility])  X52.841     E66.01     Z68.38       2. Colon cancer screening  Z12.11 Cologuard colon cancer screening      3. Essential hypertension  I10 LIPID PANEL     HEPATIC FUNCTION PANEL     CBC/DIFF     URINALYSIS, MACROSCOPIC AND MICROSCOPIC W/CULTURE REFLEX     BASIC METABOLIC PANEL     THYROID STIMULATING HORMONE (SENSITIVE TSH)      4. Hyperlipidemia, mixed  E78.2       5. Gastroesophageal reflux disease without esophagitis  K21.9       6. Acquired hypothyroidism  E03.9       7. Pre-diabetes   R73.03          Plan:  Orders Placed This Encounter    LIPID PANEL    HEPATIC FUNCTION PANEL    CBC/DIFF    URINALYSIS, MACROSCOPIC AND MICROSCOPIC W/CULTURE REFLEX    BASIC METABOLIC PANEL    THYROID STIMULATING HORMONE (SENSITIVE TSH)    levothyroxine (SYNTHROID) 75 mcg Oral Tablet    rosuvastatin (CRESTOR) 10 mg Oral Tablet    ergocalciferol, vitamin  D2, (DRISDOL) 1,250 mcg (50,000 unit) Oral Capsule    pantoprazole (PROTONIX) 40 mg Oral Tablet, Delayed Release (E.C.)    semaglutide, weight loss, (WEGOVY) 0.25 mg/0.5 mL Subcutaneous Pen Injector    Cologuard colon cancer screening     Today we ordered a Cologuard and Wegovy to start and I told her that we should start with 0.25 then go 2.5 11.7 and 2.4.  Increased her pantoprazole to 40 mg twice a day.  I refilled her vitamin-D for vitamin-D deficiency in his Crestor for mixed hyperlipidemia and I recommend she stay on a heart healthy low-fat low-cholesterol diet and avoid high fructose corn syrup and concentrated sweets.  I continued her on her 75 mcg of levothyroxine as this is adequate to keep her TSH in his right range.  For 6 months from now I have ordered a lipid panel hepatic function panel CBC with diff urinalysis basic metabolic panel thyroid-stimulating hormone.  If the patient's insurance does not pay for Wegovy I suggest the patient go to 1 of the compounding clinics to get the semaglutide.  She was instructed on how to use the [facility] inject her pins.  She should stay on a diet that is 50% non starchy vegetables 25% lean protein and 25% starchy vegetables grains cereals beans and legumes.  I reviewed with her the results of her EGD and colonoscopy done in 2020.  More than 50% of the visit was spent counseling and coordinating patient care.  All questions were answered to his satisfaction of the patient.  Anticipate the patient will be able to return to work on October 14th  and for the 1st several weeks I think she should be allowed to sit on a chair  periodically to take the pressure off her sacrum and coccyx.    Return in about 6 months (around 10/04/2023).    Mickey Farber, DO     Portions of this note may be dictated using voice recognition software or a dictation service. Variances in spelling and vocabulary are possible and unintentional. Not all errors are caught/corrected. Please notify the [facility] Parkin if any discrepancies are noted or if the meaning of any statement is not clear.

## 2023-04-05 NOTE — Nursing Note (Signed)
04/05/23 1104   Recent Weight Change   Have you had a recent unexplained weight loss or gain? N   Domestic Violence   Because we are aware of abuse and domestic violence today, we ask all patients: Are you being hurt, hit, or frightened by anyone at your home or in your life?  N   Basic Needs   Do you have any basic needs within your home that are not being met? (such as Food, Shelter, Civil [employer] fast streamer, Tranportation, paying for bills and/or medications) N

## 2023-04-06 ENCOUNTER — Other Ambulatory Visit (INDEPENDENT_AMBULATORY_CARE_PROVIDER_SITE_OTHER): Payer: Self-pay

## 2023-04-13 ENCOUNTER — Encounter (INDEPENDENT_AMBULATORY_CARE_PROVIDER_SITE_OTHER): Payer: Self-pay | Admitting: Family Medicine

## 2023-04-18 ENCOUNTER — Encounter (INDEPENDENT_AMBULATORY_CARE_PROVIDER_SITE_OTHER): Payer: Self-pay

## 2023-04-18 ENCOUNTER — Other Ambulatory Visit (INDEPENDENT_AMBULATORY_CARE_PROVIDER_SITE_OTHER): Payer: Self-pay | Admitting: Family Medicine

## 2023-04-18 DIAGNOSIS — R195 Other fecal abnormalities: Secondary | ICD-10-CM

## 2023-04-18 LAB — COLOGUARD® COLON CANCER SCREEN: COLOGUARD RESULT: POSITIVE — AB

## 2023-05-03 ENCOUNTER — Encounter (INDEPENDENT_AMBULATORY_CARE_PROVIDER_SITE_OTHER): Payer: Self-pay | Admitting: Surgery

## 2023-05-03 ENCOUNTER — Other Ambulatory Visit: Payer: Self-pay

## 2023-05-03 ENCOUNTER — Ambulatory Visit (INDEPENDENT_AMBULATORY_CARE_PROVIDER_SITE_OTHER): Payer: Medicaid Other | Admitting: Surgery

## 2023-05-03 VITALS — BP 123/81 | HR 77 | Temp 98.0°F | Resp 18 | Ht 67.75 in | Wt 241.6 lb

## 2023-05-03 DIAGNOSIS — K219 Gastro-esophageal reflux disease without esophagitis: Secondary | ICD-10-CM

## 2023-05-03 DIAGNOSIS — R195 Other fecal abnormalities: Secondary | ICD-10-CM

## 2023-05-03 DIAGNOSIS — R109 Unspecified abdominal pain: Secondary | ICD-10-CM

## 2023-05-03 MED ORDER — SUCRALFATE 1 GRAM TABLET: 1.0000 g | ORAL_TABLET | Freq: Three times a day (TID) | ORAL | 1 refills | Status: DC

## 2023-05-05 ENCOUNTER — Ambulatory Visit (HOSPITAL_COMMUNITY): Payer: Self-pay

## 2023-05-06 ENCOUNTER — Encounter (INDEPENDENT_AMBULATORY_CARE_PROVIDER_SITE_OTHER): Payer: Self-pay | Admitting: Surgery

## 2023-05-06 NOTE — H&P (Signed)
Office History and Physical      Reason for Visit: Colonoscopy (Positive Cologuard,  ) and EGD (Having heartburn)    History of Present Illness  Dominique Carrillo presents as a referral by Dr. Jacqulyn Bath for evaluation of endoscopy with history of positive Cologuard and gastroesophageal reflux disease with persistent symptoms despite treatment.  Feels that her symptoms are exacerbated by use of analgesics from a broken sacrum in August.  On proton pump inhibitor therapy currently .  No melena or hematochezia.  No hematemesis.  No history of polyps      I have reviewed the patient's provided medical records and diagnostic testing including laboratory values, imaging results, documented encounters and providers notes with all pertinent information noted with respect to today's evaluation serving as unique tests and sources as a component of the medical decision making process for this encounter relevant to the patients independent evaluation by me today.        Patient Data  Patient History  Past Medical History:   Diagnosis Date    DVT (deep venous thrombosis) (CMS HCC)     Left Calf    Hypothyroidism     Osteopenia     Tendonitis of wrist, right     Tinnitus of both ears     Vitamin D deficiency          Past Surgical History:   Procedure Laterality Date    COLONOSCOPY  08/27/2018    Dr. Abbe Amsterdam    ESOPHAGOGASTRODUODENOSCOPY  08/27/2018    Dr. Arley Phenix DILATION AND CURETTAGE      URETHRAL DILATION      when 57 years old         Current Outpatient Medications   Medication Sig    aspirin 81 mg Oral Tablet, Chewable Chew 1 Tablet (81 mg total) Once a day    CALCIUM 26-VIT D3-MAGNESIUM 15 ORAL Take 2 Tablets by mouth Once a day 2 gummies daily    ergocalciferol, vitamin D2, (DRISDOL) 1,250 mcg (50,000 unit) Oral Capsule Take 1 Capsule (50,000 Units total) by mouth Every 7 days for 180 days    famotidine (PEPCID) 40 mg Oral Tablet TAKE ONE TABLET BY MOUTH EVERY EVENING    hydrOXYzine pamoate (VISTARIL) 50 mg Oral Capsule  TAKE 1 CAPSULE BY MOUTH TWICE A DAY AS NEEDED    levothyroxine (SYNTHROID) 75 mcg Oral Tablet Take 1 Tablet (75 mcg total) by mouth Every morning for 180 days    pantoprazole (PROTONIX) 40 mg Oral Tablet, Delayed Release (E.C.) Take 1 Tablet (40 mg total) by mouth Twice daily Indications: gastroesophageal reflux disease, stomach ulcer from aspirin/ibuprofen-like drugs prevention    rosuvastatin (CRESTOR) 10 mg Oral Tablet Take 1 Tablet (10 mg total) by mouth Once a day for 180 days    spironolacton-hydrochlorothiaz (ALDACTAZIDE) 25-25 mg Oral Tablet Take 1 Tablet by mouth Every other day    sucralfate (CARAFATE) 1 gram Oral Tablet Take 1 Tablet (1 g total) by mouth Three times daily before meals     Allergies   Allergen Reactions    Amoxicillin  Other Adverse Reaction (Add comment)     Mouth Blisters/Swelling    Augmentin [Amoxicillin-Pot Clavulanate]      Blisters in mouth    Propoxyphene Nausea/ Vomiting     Family Medical History:       Problem Relation (Age of Onset)    Elevated Lipids Mother, Father, Sister    Heart Attack Mother    Hypertension (High  Blood Pressure) Mother, Father, Sister    Hypothyroidism Mother, Father, Sister    No Known Problems Brother, Maternal Grandmother, Maternal Grandfather, Paternal Grandmother, Paternal Grandfather, Daughter, Son, Maternal Aunt, Maternal Uncle, Paternal Aunt, Paternal Uncle, Other            Social History     Tobacco Use    Smoking status: Former     Current packs/day: 0.00     Types: Cigarettes    Smokeless tobacco: Never   Vaping Use    Vaping status: Never Used   Substance Use Topics    Alcohol use: Yes     Comment: Holiday/special occasion.    Drug use: Never        The above documented section regarding past medical, past surgical, family, and social history (PMFSH) has been reviewed and considered and to the best of my knowledge represents a valid and accurate reflection of the patient's previous pertinent experiences documented by multiple providers and  participants of the EMR.I cannot attest to all entries but do no recognize any gross inaccuracies as the data is a common field across all providers  Further history pertinent to the current encounter will be found as referenced       Physical Examination:    Vitals:    05/03/23 1512   BP: 123/81   Pulse: 77   Resp: 18   Temp: 36.7 C (98 F)   SpO2: 100%   Weight: 110 kg (241 lb 9.6 oz)   Height: 1.721 m (5' 7.75")   BMI: 37.08          General:appropriate for age. in no acute distress.  HEENT:Atraumatic, Normocephalic.   Lungs:Nonlabored breathing with symmetric expansion  Heart:Regular wth respect to rate and rythmn.    Abdomen:Soft. Nontender. Nondistended  Bowel sounds are present. No peritoneal signs  Skin:No Rashes. No ulcers. Skin warm  Psychiatric:Alert and oriented to person, place, and time. affect appropriate        Diagnosis:    ICD-10-CM    1. Gastroesophageal reflux disease  K21.9       2. Positive colorectal cancer screening using Cologuard test  R19.5       3. Abdominal pain  R10.9           Plan:    Discussed indications, risks and benefits of colonoscopy and upper endoscopy with the patient.  Discussed the possibility of polypectomy, biopsies, and possible repeat examinations.  Risks include bleeding, sedation risks, possibility of missed diagnosis of polyp or malignancy, and remote possibilities of perforation and death.  All questions were answered and informed consent was clearly obtained      This note may have been partially generated using MModal Fluency Direct system, and there may be some incorrect words, spellings, and punctuation that were not noted in checking the note before saving, though effort was made to avoid such errors.    Clide Dales MD FACS RVT  [facility] -General Surgery

## 2023-05-10 ENCOUNTER — Encounter (INDEPENDENT_AMBULATORY_CARE_PROVIDER_SITE_OTHER): Payer: Self-pay | Admitting: Family Medicine

## 2023-05-10 NOTE — Nursing Note (Signed)
Patient called requesting a CT or MRI of right hip states that she is still having pain in the right hip/groin/pubic area.   Per v/o MRI ordered of right hip.

## 2023-05-11 ENCOUNTER — Other Ambulatory Visit (INDEPENDENT_AMBULATORY_CARE_PROVIDER_SITE_OTHER): Payer: Self-pay | Admitting: Family Medicine

## 2023-05-11 DIAGNOSIS — R102 Pelvic and perineal pain: Secondary | ICD-10-CM

## 2023-05-11 DIAGNOSIS — R52 Pain, unspecified: Secondary | ICD-10-CM

## 2023-05-11 DIAGNOSIS — G8929 Other chronic pain: Secondary | ICD-10-CM

## 2023-05-19 IMAGING — MR MRI PELVIS WITHOUT CONTRAST
4 of 10 series · 20 of 48 positions shown · non-contrast
Comparison: None available.

﻿EXAM:  [DATE]   MRI HIP RT W/O CONTRAST,MRI PELVIS WITHOUT CONTRAST
INDICATION: Right hip pain.
TECHNIQUE: Noncontrast multiplanar, multisequence MRI was performed.

[Series 7: T1 · axial · right · 6.5mm · 0.88mm/px · z∈[-104,+113]mm · 6 of 32 slices shown (1 of 2)]
[im 1/32]
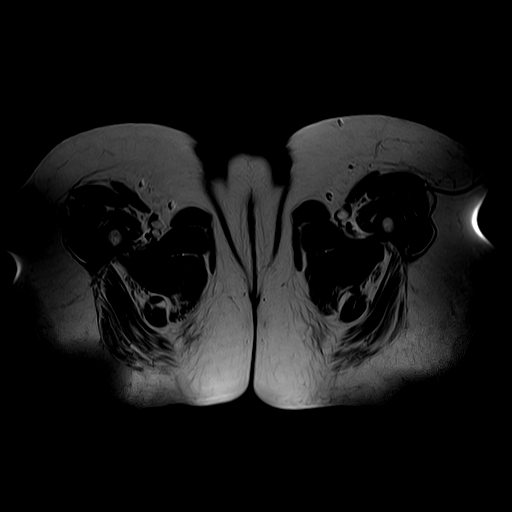
[im 7/32]
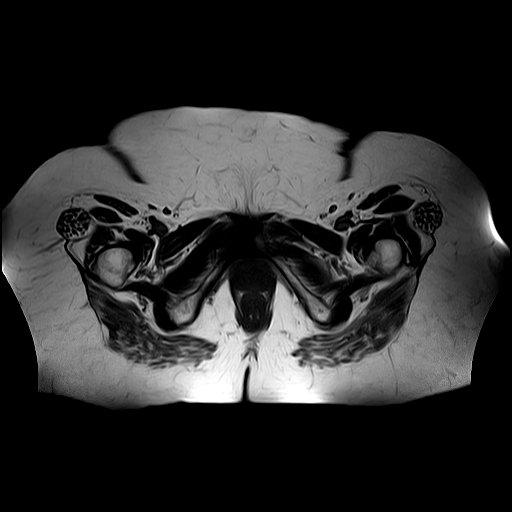
[im 13/32]
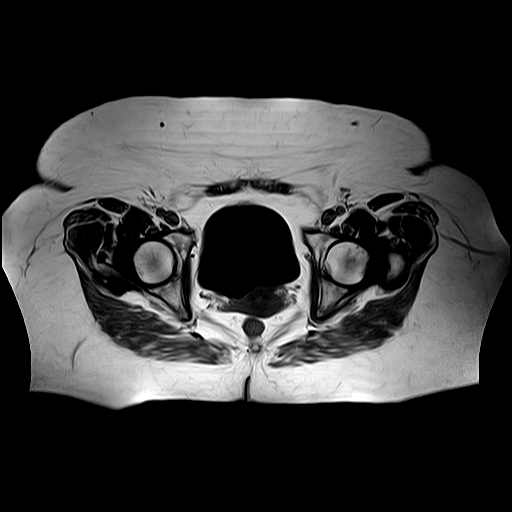
[im 19/32]
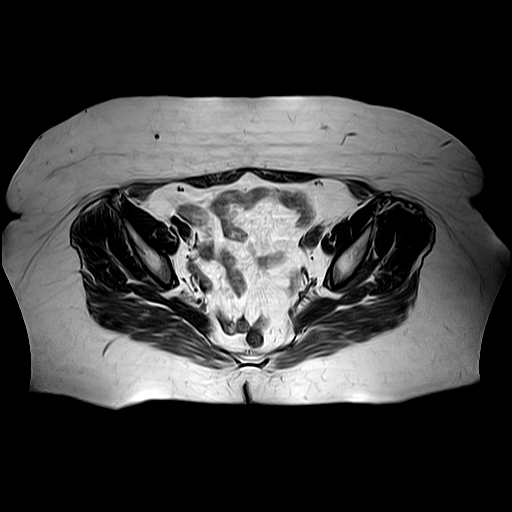
[im 25/32]
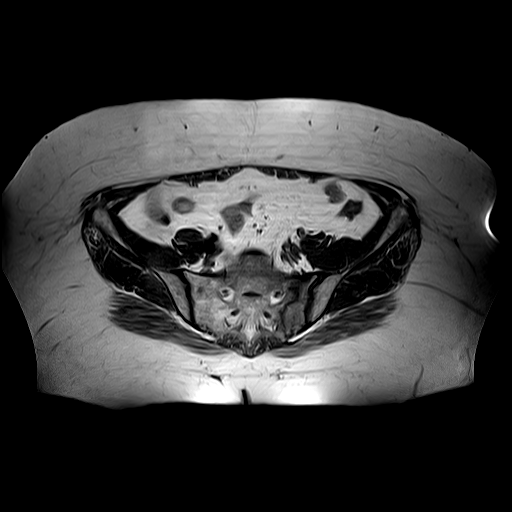
[im 32/32]
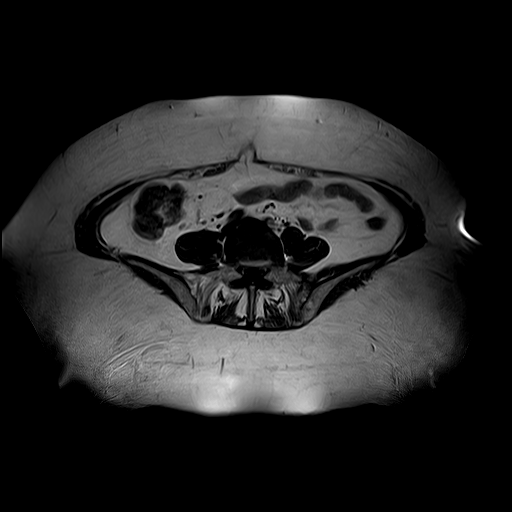

[Series 8: T2 fat-sat · axial · right · 6.5mm · 0.88mm/px · z∈[-107,+124]mm · 6 of 34 slices shown]
[im 1/34]
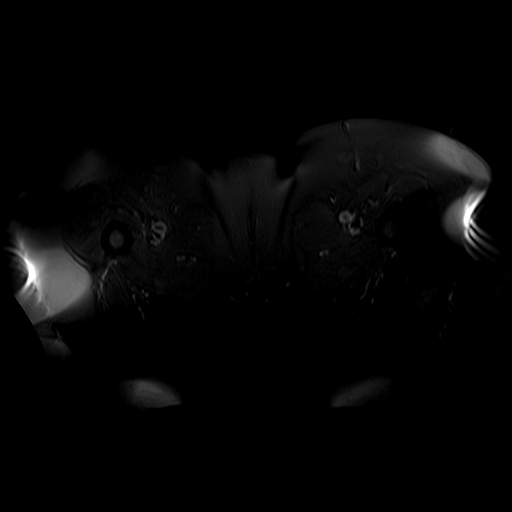
[im 7/34]
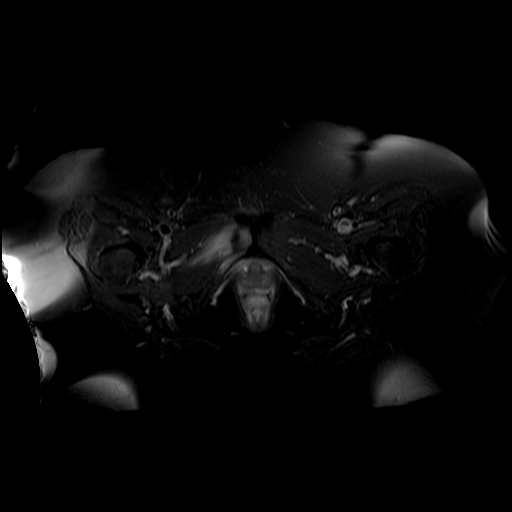
[im 14/34]
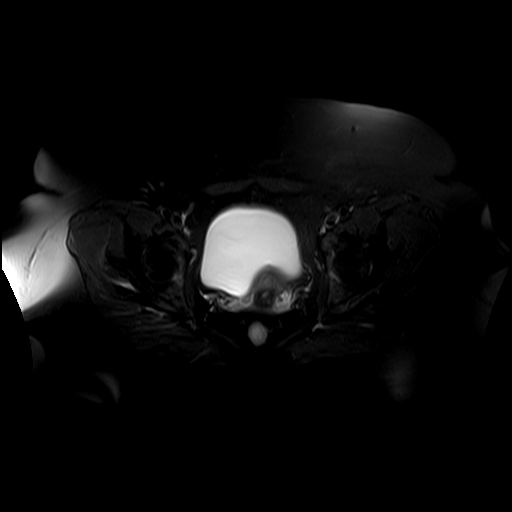
[im 20/34]
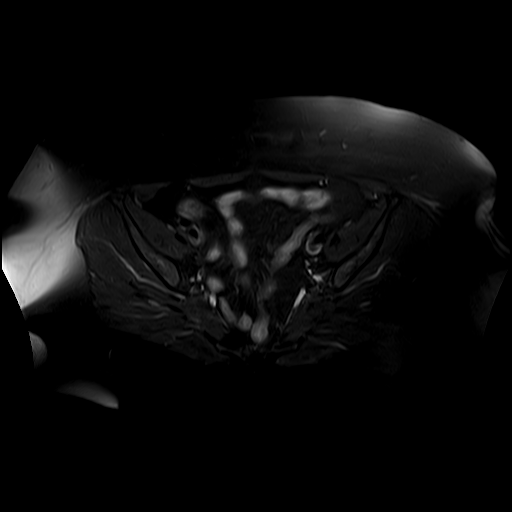
[im 27/34]
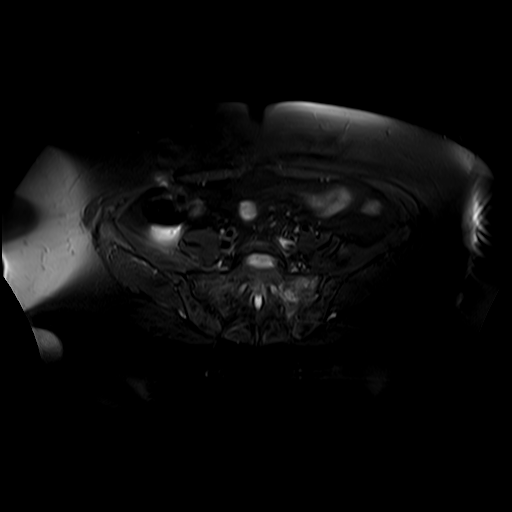
[im 34/34]
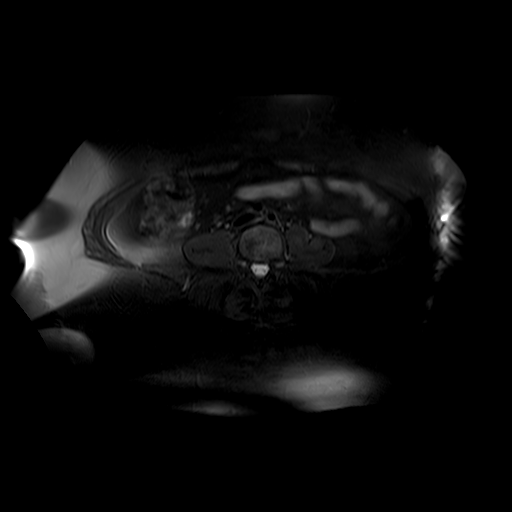

[Series 9: STIR · axial · right · 6.5mm · 0.88mm/px · z∈[-107,+124]mm · 5 of 34 slices shown]
[im 1/34]
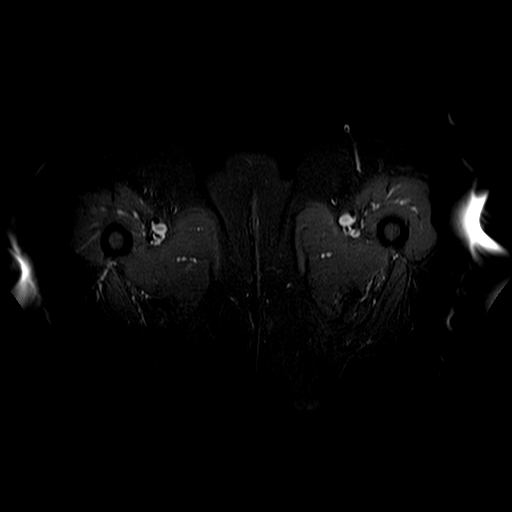
[im 9/34]
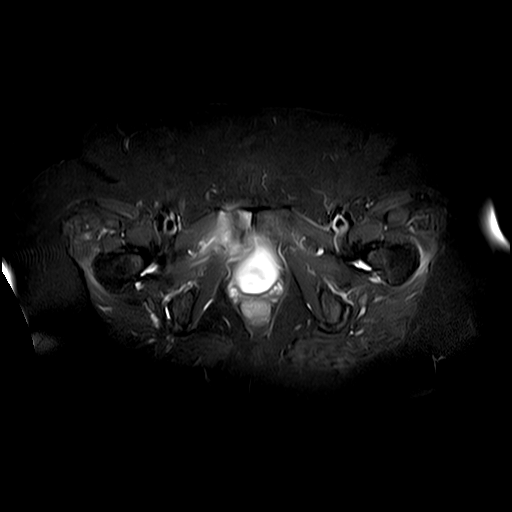
[im 17/34]
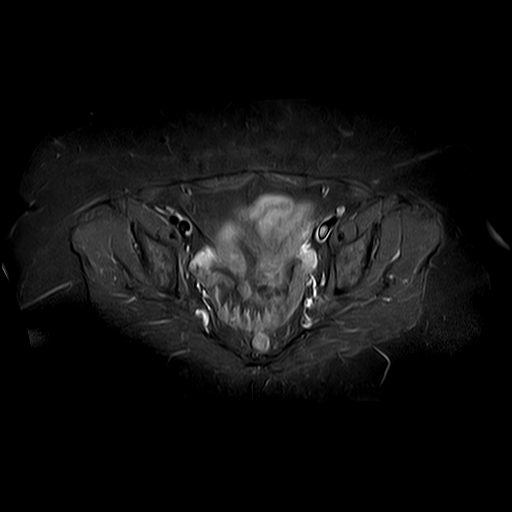
[im 25/34]
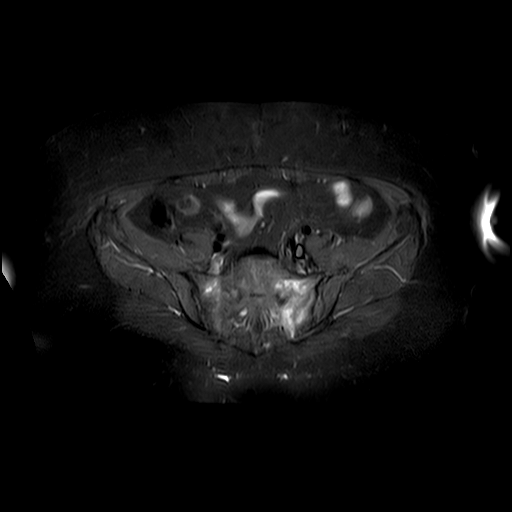
[im 34/34]
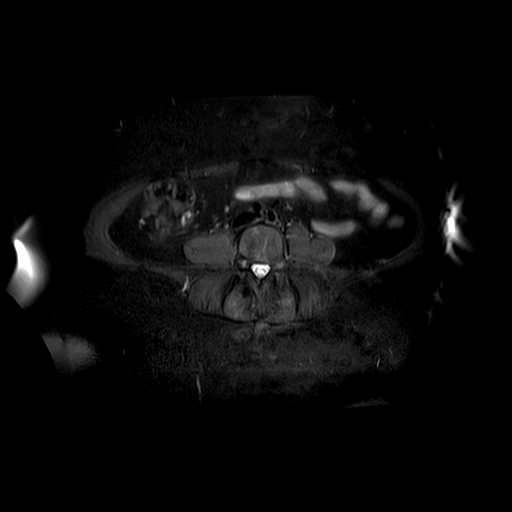

[Series 10: T1 · coronal · right · 6.0mm · 0.88mm/px · 3 of 26 slices shown (2 of 2)]
[im 1/26]
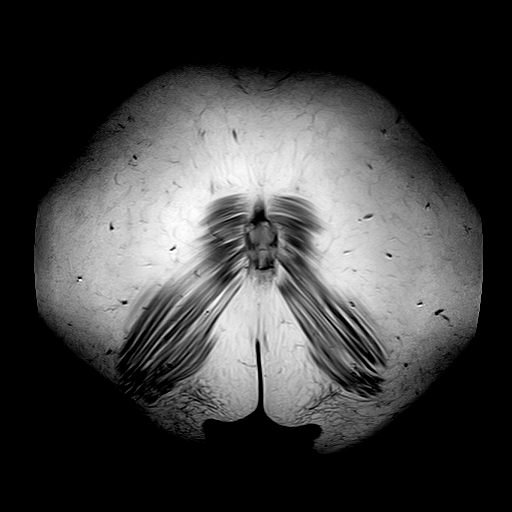
[im 17/26]
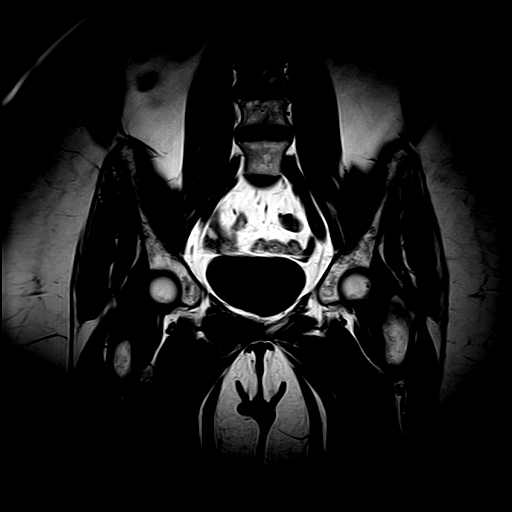
[im 26/26]
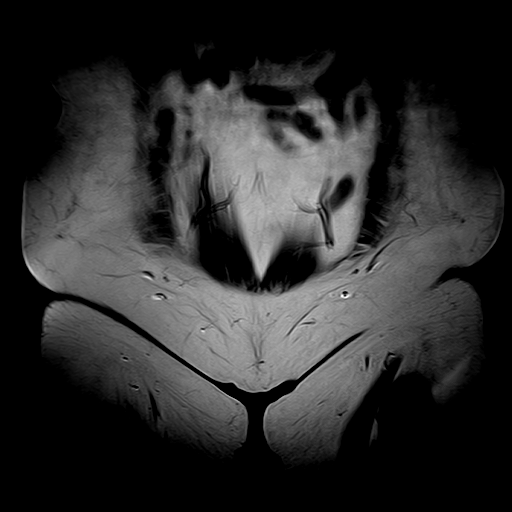

[20 of 48 positions shown; findings below may reference images not displayed]

FINDINGS: There are mild degenerative changes involving both hip joints.  

There is no fracture, dislocation, or marrow signal alteration.  There is no significant joint effusion or evidence of bursitis.  There is no avascular necrosis.  No muscle or tendon tear is seen.
IMPRESSION: 1. Mild osteoarthritis.

2. No fracture is seen.

## 2023-05-23 ENCOUNTER — Telehealth (INDEPENDENT_AMBULATORY_CARE_PROVIDER_SITE_OTHER): Payer: Self-pay | Admitting: Family Medicine

## 2023-05-23 NOTE — Nursing Note (Signed)
Spoke to patient and reviewed MRI of hips per Dr Jacqulyn Bath. Patient states that she has been taking Ibuprofen for the pain but it's hard on her stomach and asks if there is anything else she can take.

## 2023-05-24 ENCOUNTER — Other Ambulatory Visit (INDEPENDENT_AMBULATORY_CARE_PROVIDER_SITE_OTHER): Payer: Self-pay | Admitting: Family Medicine

## 2023-05-24 DIAGNOSIS — R52 Pain, unspecified: Secondary | ICD-10-CM

## 2023-05-24 DIAGNOSIS — R102 Pelvic and perineal pain: Secondary | ICD-10-CM

## 2023-05-24 DIAGNOSIS — G8929 Other chronic pain: Secondary | ICD-10-CM

## 2023-05-24 NOTE — Telephone Encounter (Signed)
Patient informed. Voices understanding. Patient requests for PT order to be placed for assistance with exercises. Orders placed at this time.

## 2023-06-02 ENCOUNTER — Other Ambulatory Visit: Payer: Self-pay

## 2023-06-02 ENCOUNTER — Ambulatory Visit
Admission: RE | Admit: 2023-06-02 | Discharge: 2023-06-02 | Disposition: A | Discharge status: Still In Hospital | Payer: Medicaid Other | Source: Ambulatory Visit | Attending: Family Medicine | Admitting: Family Medicine

## 2023-06-02 DIAGNOSIS — R102 Pelvic and perineal pain: Secondary | ICD-10-CM | POA: Insufficient documentation

## 2023-06-02 DIAGNOSIS — G8929 Other chronic pain: Secondary | ICD-10-CM | POA: Insufficient documentation

## 2023-06-02 DIAGNOSIS — R52 Pain, unspecified: Secondary | ICD-10-CM | POA: Insufficient documentation

## 2023-06-02 DIAGNOSIS — M25551 Pain in right hip: Secondary | ICD-10-CM | POA: Insufficient documentation

## 2023-06-02 NOTE — PT Evaluation (Signed)
[facility]  Outpatient Physical Therapy  [address]  [place], [address]  [phone]  (Fax) [phone]      Physical Therapy Lower Extremity Evaluation    Date: 06/02/2023  Patient's Name: Dominique Carrillo  Date of Birth: Dec 02, 1965  Physical Therapy Evaluation     Evaluating Physical Therapist: Marcell Anger, PT, DPT  PT diagnosis/Reason for Referral: right hip pain  Next Scheduled Physician Appointment: TBD  Allergies/Contraindications: n/a                   SUBJECTIVE  Date of onset: 02/09/2023    Mechanism of injury: Missed a step at home a fell resulting in a non-displaced left pelvic fracture.  However patient has pain in the right anterior hip that goes to the stomach and down the right leg    Current Presentation: Right anterior thigh.  Left pelvic fracture healed on it's own and had home health PT.      PLOF: Used walker after injury and still has to use the walker occasionally due to the right pain.    Previous episodes/treatments: Home health PT    Medications for this problem: anti-inflammatory    Diagnostic tests:   Right hip x-ray: IMPRESSION:  NO ACUTE FRACTURE OR DISLOCATION.     Past Medical History:   Past Medical History:   Diagnosis Date    DVT (deep venous thrombosis) (CMS HCC)     Left Calf    Hypothyroidism     Osteopenia     Tendonitis of wrist, right     Tinnitus of both ears     Vitamin D deficiency          Past Surgical History:   Past Surgical History:   Procedure Laterality Date    COLONOSCOPY  08/27/2018    Dr. Abbe Amsterdam    ESOPHAGOGASTRODUODENOSCOPY  08/27/2018    Dr. 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    Next MD visit: TBD    Pain location: right anterior thigh                    Pain description: SHARP, DULL, ACHING, STABBING, SHOOTING, and BURNING    Pain  frequency:  INTERMITTENT    Pain rating: Now 1   Best 0   Worst 10    Radiculopathy: anterior thigh    Pain increases with: POSITION CHANGE, ADLs, ACTIVITY, EXERCISE, STAND, WALK, STAIR CLIMBING, BENDING, and LIFTING           decreases with : MEDICATION and REST    Sensation: Denies    Weakness: bilateral hips    Sleep affected: Denies    Subjective Functional Reports:    Sitting: WFL    Standing: LIMITED and pain within 30 minutes    Walking: LIMITED    Lifting: LIMITED    Personal ADLs: LIMITED      Patient-Specific Functional Score:    Problem Score   1. Work duties 1   2. House ADLs 3   3. Stairs 2   Total 2   Total score = sum of the activity scores/number of activities    Minimal detectable change (90% CI) for avg  score = 2 points    Minimal detectable change (90% CI) for single activity score = 3 points             OBJECTIVE    A/PROM   right left   Hip Flexion 105 100   Hip Extension 5 10   Hip Abduction 45 30   Hip IR 25 20   Hip ER 55 45     ROM comments Pain with right hip motion     Strength  3+/5 throughout the right hip, limited by pain    Gait: NO ASSISTIVE DEVICE and trendelenburg on the right with increased frontal plane sway during stance phase     Palpation: right ASIS and proximal anterior thigh    Joint mobility:n/t    Posture:HIP FLEXION    Reflexes    Patellar 1   Achilles 1     +Supine to sit test (short to even on the right)  +thomas test on the right to just past neutral hip extension       Treatment provided:REVIEW OF POC AND GOALS WITH PATIENT, ALL QUESTIONS ANSWERED, PATIENT EDUCATION, and THERAPEUTIC EXERCISE   HEP: Access Code: [email or web address]  URL: [email or web address]  Date: 06/02/2023  Prepared by: Trinna Post Crystalyn Delia    Exercises  - Hip Flexor Stretch at Edge of Bed  - 3 x daily - 7 x weekly - 1 sets - 2 reps - 30 second hold  Heel lift in the right shoe        ASSESSMENT    Impression: [name] presents to OPPT with right anterior hip pain after sustaining a left pelvic fracture  early August.  She exhibits decreased right hip ROM and strength, gait deviations, decreased hip flexor flexibility.  She would benefit from PT to address these deficits in order for the patient to return to work duties without limiting right hip pain.    Rehab potential: FAIR      Short-Term Goals: 3 Weeks      -  Patient will demonstrate improved right hip AROM to at least 10 degrees extension to aid in ability to ambulate up and down stairs.      -  Patient will demonstrate independence with progressive HEP to maximize gains from physical therapy.      -  Patient will report max 6/10 pain to aid in completion of ADLs/work duties.      -  Patient will demonstrate improved strength of R gluteus medius in sidelying to 4-/5 to aid in functional transfers.           Long-Term Goals: 6 Weeks      -  Patient will demonstrate improved R hip strength of at least 4+/5 to aid in functional transfers.      -  Patient will demonstrate 10 squats/sit <> stands with 10# and good form to aid in completion of ADLs.      -  Patient will demonstrate improved functional ability with Patient Specific Scale Score of at least 6.      -  Patient will demonstrate ability to ambulate up and down stairs with reciprocal pattern and 1 use of UEs to aid in community ambulation.      -  Patient will demonstrate improved SLS to at least 3 seconds to decrease risk of falling.           PLAN  Patient will attend 2 times per week x 6 weeks. Therapy may include, but is not limited to  THERAPEUTIC EXERCISES, MYOFASCIAL/JOINT MOBILIZATION, POSTURE/BODY MECHANICS, ERGONOMIC TRAINING, TRANSFER/GAIT TRAINING, HOME INSTRUCTIONS, HEAT/COLD, ULTRASOUND, ELECTRICAL STIMULATION, and KINESIOTAPE    Plan for next visit: initiate resistive nustep warmup followed by manual therapy for anterior hip mobilization and stretching and therex for right lateral hip strengthening.  Progress to CKC strengthening with mirror training to improve gait  mechanics.       Evaluation complexity:   Personal factors impacting POC: OCCUPATIONAL ADLS (IE HEAVY LIFTING, REPETITIVE TASKS, LONG HOURS)   Co-morbidities impacting POC: OBESITY  Complexity of physical exam: INCLUDING MUSCULOSKELETAL SYSTEM (POSTURE, ROM, STRENGTH, HEIGHT/WEIGHT), INCLUDING NEUROMUSCULAR EXAM (BALANCE, GAIT, LOCOMOTION, MOBILITY), and INCLUDING ACTIVITY/MOBILITY RESTRICTIONS   Clinical Presentation: STABLE   Evaluation Complexity: LOW-HISTORY 0, EXAMINATION 1-2, STABLE PRESENTATION      Total Session Time 44, Timed code minutes 8, and Untimed code minutes 36         Intervention minutes: EVALUATION 36 minutes and THERAPEUTIC EXERCISE 8 minutes    Laylynn Campanella, PT  06/02/2023, 08:50                Certification:    From:______  Through:_________    I certify the need for these services furnished under this plan of treatment and while under my care.    Referring Provider Signature: _______________     Date : _____________________    Printed name of Referring Provider________________________________________

## 2023-06-06 ENCOUNTER — Other Ambulatory Visit (INDEPENDENT_AMBULATORY_CARE_PROVIDER_SITE_OTHER): Payer: Self-pay | Admitting: Family Medicine

## 2023-06-06 MED ORDER — FAMOTIDINE 40 MG TABLET
40.0000 mg | ORAL_TABLET | Freq: Two times a day (BID) | ORAL | 1 refills | Status: DC
Start: 2023-06-06 — End: 2023-12-14

## 2023-06-07 ENCOUNTER — Encounter (HOSPITAL_COMMUNITY): Admission: RE | Payer: Self-pay | Source: Ambulatory Visit

## 2023-06-07 ENCOUNTER — Inpatient Hospital Stay: Admission: RE | Admit: 2023-06-07 | Payer: Medicaid Other | Source: Ambulatory Visit | Admitting: Surgery

## 2023-06-07 ENCOUNTER — Ambulatory Visit (HOSPITAL_COMMUNITY): Payer: Self-pay

## 2023-06-07 SURGERY — GASTROSCOPY WITH BIOPSY
Anesthesia: General

## 2023-06-09 ENCOUNTER — Ambulatory Visit (HOSPITAL_COMMUNITY): Payer: Self-pay

## 2023-06-10 ENCOUNTER — Other Ambulatory Visit (INDEPENDENT_AMBULATORY_CARE_PROVIDER_SITE_OTHER): Payer: Self-pay | Admitting: Family Medicine

## 2023-06-12 ENCOUNTER — Other Ambulatory Visit (INDEPENDENT_AMBULATORY_CARE_PROVIDER_SITE_OTHER): Payer: Self-pay | Admitting: Family Medicine

## 2023-06-13 ENCOUNTER — Other Ambulatory Visit: Payer: Self-pay

## 2023-06-13 ENCOUNTER — Ambulatory Visit (HOSPITAL_COMMUNITY)
Admission: RE | Admit: 2023-06-13 | Discharge: 2023-06-13 | Disposition: A | Discharge status: Still In Hospital | Payer: Medicaid Other | Source: Ambulatory Visit

## 2023-06-13 DIAGNOSIS — R102 Pelvic and perineal pain: Secondary | ICD-10-CM | POA: Insufficient documentation

## 2023-06-13 DIAGNOSIS — R52 Pain, unspecified: Secondary | ICD-10-CM | POA: Insufficient documentation

## 2023-06-13 DIAGNOSIS — M25551 Pain in right hip: Secondary | ICD-10-CM | POA: Insufficient documentation

## 2023-06-13 DIAGNOSIS — G8929 Other chronic pain: Secondary | ICD-10-CM | POA: Insufficient documentation

## 2023-06-13 NOTE — PT Treatment (Signed)
[facility]  Outpatient Physical Therapy  [address]  [place], [address]  [phone]  (Fax) [phone]    Physical Therapy Treatment Note    Date: 06/13/2023  Patient's Name: Dominique Carrillo  Date of Birth: 09-11-1965  Physical Therapy Visit            Visit #/POC:2 out of 12  Authorization: 9 (11/29 - 12/31)  POC Signed?: no  POC Ends: 1/10  Order Ends: n/a  Next Progress Note Due: POC      Evaluating Physical Therapist: Marcell Anger, PT, DPT  PT diagnosis/Reason for Referral: right hip pain  Next Scheduled Physician Appointment: TBD  Allergies/Contraindications: n/a          Subjective: Patient reports she has done the stretching on the front part of the right hip that helps some but the same issue is standing on concrete for 8 hours a day for her job.  Right hip symptoms at 6/10 today    Objective: Therex for right hip strengthening and mobility per flow sheet    Measured ROM: n/t  EXERCISE/ACTIVITY NAME REPETITIONS RESISTANCE COMPLETED THIS DOS   NuStep   5 minutes 4 Y   Seated hip IR   2x15 Red at ankles Y   Bridges   15 Green Y   Supine clamshells L/R  Supine hip march   12 each side  12 each side [name]   Piriformis stretch (Knee to opposite shoulder)   30 seconds manual Y   Side lying clamshell   Side lying hip abduction   15  15 Green  AROM Y  Y   Prone hip extension  Prone hip extension with knee flexed to 90  Prone hip IR   15  10    15  AROM  AROM    Red Y  Y    Y   Table STS   12 No UE assist Y   Standing hip abduction alternating   12 Finger tip assist on // bars Y         Assessment: Patient ambulates in with antalgic gait pattern.  NuStep warmup subjectively decreased right hip pain down to 4/10.  Tolerated NWB exercises very well with most weakness noted with prone hup extension with glute max bias and prone hip IR.    Short-Term Goals: 3 Weeks      -  Patient will demonstrate improved right hip AROM to at least 10 degrees extension to aid in  ability to ambulate up and down stairs.      -  Patient will demonstrate independence with progressive HEP to maximize gains from physical therapy.      -  Patient will report max 6/10 pain to aid in completion of ADLs/work duties.      -  Patient will demonstrate improved strength of R gluteus medius in sidelying to 4-/5 to aid in functional transfers.           Long-Term Goals: 6 Weeks      -  Patient will demonstrate improved R hip strength of at least 4+/5 to aid in functional transfers.      -  Patient will demonstrate 10 squats/sit <> stands with 10# and good form to aid in completion of ADLs.      -  Patient will demonstrate improved functional ability with Patient Specific Scale Score of at least 6.      -  Patient will  demonstrate ability to ambulate up and down stairs with reciprocal pattern and 1 use of UEs to aid in community ambulation.      -  Patient will demonstrate improved SLS to at least 3 seconds to decrease risk of falling.     Plan: Monitor response to therex.  Based on response, consider updating HEP.    Total Session Time 34 and Timed code minutes 34  THERAPEUTIC EXERCISE 34 minutes      Javel Hersh, PT  06/13/2023, 16:44

## 2023-06-16 ENCOUNTER — Ambulatory Visit (HOSPITAL_COMMUNITY)
Admission: RE | Admit: 2023-06-16 | Discharge: 2023-06-16 | Disposition: A | Discharge status: Still In Hospital | Payer: Medicaid Other | Source: Ambulatory Visit

## 2023-06-16 ENCOUNTER — Other Ambulatory Visit: Payer: Self-pay

## 2023-06-16 NOTE — PT Treatment (Signed)
[facility]  Outpatient Physical Therapy  [address]  [place], [address]  [phone]  (Fax) [phone]    Physical Therapy Treatment Note    Date: 06/16/2023  Patient's Name: Dominique Carrillo  Date of Birth: [birth year]  Physical Therapy Visit      Visit #/POC: 3 out of 12  Authorization: 9 (11/29 - 12/31)  POC Signed?: no  POC Ends: 1/10  Order Ends: n/a  Next Progress Note Due: POC        Evaluating Physical Therapist: Marcell Anger, PT, DPT  PT diagnosis/Reason for Referral: right hip pain  Next Scheduled Physician Appointment: TBD  Allergies/Contraindications: n/a              Subjective: Patient reports she was a little sore after last session but got better the next day and she didn't have to take Ibuprofen like she normally does.     Objective: Therex for right hip strengthening and mobility per flow sheet     Measured ROM: n/t  EXERCISE/ACTIVITY NAME REPETITIONS RESISTANCE COMPLETED THIS DOS   NuStep    5 minutes 4 Y   Seated hip IR    2x15 Red at ankles Y   Bridges    15 Green Y   Supine clamshells L/R  Supine hip march    12 each side  12 each side [name]   Piriformis stretch (Knee to opposite shoulder)    30 seconds manual Y   Side lying clamshell   Side lying hip abduction  Side lying reverse clamshell    15  15  15  Green  AROM  AROM Y  Y  Y   Prone hip extension  Prone hip extension with knee flexed to 90  Prone hip IR    15  10     15  AROM  AROM     Red Y  Y     Y   Table STS    12 No UE assist with staggered stance  Y   Standing hip abduction alternating    12 Finger tip assist on // bars Y       Access Code: [email or web address]  URL: [email or web address]  Date: 06/16/2023  Prepared by: Trinna Post Jillana Selph    Exercises  - Hip Flexor Stretch at Edge of Bed  - 3 x daily - 7 x weekly - 1 sets - 2 reps - 30 second hold  - Supine Piriformis Stretch with Leg Straight  - 3 x daily - 7 x weekly - 1 sets - 2 reps - 30 second hold  - Clam with Resistance  - 3 x  daily - 7 x weekly - 1 sets - 15 reps  - Sidelying Reverse Clamshell  - 3 x daily - 7 x weekly - 1 sets - 15 reps  - Prone Hip Extension with Bent Knee  - 3 x daily - 7 x weekly - 1 sets - 15 reps     Assessment: Tolerating exercises much better today and was able to progress sit to stands to staggered stance to emphasize the RLE.  Updated HEP to include hip strengthening and stretching as noted above.       Short-Term Goals: 3 Weeks      -  Patient will demonstrate improved right hip AROM to at least 10 degrees extension to aid in ability to ambulate up and down stairs.      -  Patient will  demonstrate independence with progressive HEP to maximize gains from physical therapy.      -  Patient will report max 6/10 pain to aid in completion of ADLs/work duties.      -  Patient will demonstrate improved strength of R gluteus medius in sidelying to 4-/5 to aid in functional transfers.           Long-Term Goals: 6 Weeks      -  Patient will demonstrate improved R hip strength of at least 4+/5 to aid in functional transfers.      -  Patient will demonstrate 10 squats/sit <> stands with 10# and good form to aid in completion of ADLs.      -  Patient will demonstrate improved functional ability with Patient Specific Scale Score of at least 6.      -  Patient will demonstrate ability to ambulate up and down stairs with reciprocal pattern and 1 use of UEs to aid in community ambulation.      -  Patient will demonstrate improved SLS to at least 3 seconds to decrease risk of falling.      Plan: Monitor response to progressed therex and updated HEP.  Continue progression as tolerated.    Total Session Time 43 and Timed code minutes 43  THERAPEUTIC EXERCISE 43 minutes      Diamon Reddinger, PT  06/16/2023, 09:30

## 2023-06-17 ENCOUNTER — Other Ambulatory Visit (INDEPENDENT_AMBULATORY_CARE_PROVIDER_SITE_OTHER): Payer: Self-pay | Admitting: Family Medicine

## 2023-06-19 ENCOUNTER — Ambulatory Visit (HOSPITAL_COMMUNITY)
Admission: RE | Admit: 2023-06-19 | Discharge: 2023-06-19 | Disposition: A | Discharge status: Still In Hospital | Payer: Medicaid Other | Source: Ambulatory Visit

## 2023-06-19 NOTE — PT Treatment (Signed)
[facility] Medicine [facility]  Outpatient Physical Therapy  [address]  [place], [address]  [phone]  (Fax) [phone]    Physical Therapy Treatment Note    Date: 06/19/2023  Patient's Name: Dominique Carrillo  Date of Birth: 06-05-1966  Physical Therapy Visit        Visit #/POC: 4 out of 12  Authorization: 9 (11/29 - 12/31)  POC Signed?: no  POC Ends: 1/10  Order Ends: n/a  Next Progress Note Due: POC        Evaluating Physical Therapist: Marcell Anger, PT, DPT  PT diagnosis/Reason for Referral: right hip pain  Next Scheduled Physician Appointment: TBD  Allergies/Contraindications: n/a      Subjective: Pt reports minimal changes.  States she has pain at work.  Feels like she wobbles when she walks.  Notes she continues to have pain from belly to her knee.  States sometimes a deep throbbing pain sometimes stabbing.      Objective:   Warm up on Nustep followed by trial of Psoas and Pectineus release and then hip adduction isometric.  Did test and retest using SLR and hooklying march before and after activities monitoring pain and perceived difficulty of task.  No exercise today    EXERCISE/ACTIVITY NAME REPETITIONS RESISTANCE COMPLETED THIS DOS   NuStep    5 minutes 4 Y   Psoas release R  Pectineus release  R  Hip adduction isometric      manual Y  Y  y               Seated hip IR    2x15 Red at ankles n   Bridges    15 Green n   Supine clamshells L/R  Supine hip march    12 each side  12 each side Green  Green N  n   Piriformis stretch (Knee to opposite shoulder)    30 seconds manual n   Side lying clamshell   Side lying hip abduction  Side lying reverse clamshell    15  15  15  Green  AROM  AROM N  N  n   Prone hip extension  Prone hip extension with knee flexed to 90  Prone hip IR    15  10     15  AROM  AROM     Red N  N    n   Table STS    12 No UE assist with staggered stance  n   Standing hip abduction alternating    12 Finger tip assist on // bars n        Assessment:   Pt noted  nearly full resolution of pain after Manual therapies. Noted no pain with SLR and with hooklying march and noted minimal effort to perform both tasks.  Pt with significant restriction in the R Psoas and R pectineus.  Both responded well to release techniques.  Still with some spasm noted but overall greatly improved.  Gentle adduction isometric for pelvic reset.      Short-Term Goals: 3 Weeks      -  Patient will demonstrate improved right hip AROM to at least 10 degrees extension to aid in ability to ambulate up and down stairs.      -  Patient will demonstrate independence with progressive HEP to maximize gains from physical therapy.      -  Patient will report max 6/10 pain to aid in completion of ADLs/work duties.      -  Patient will demonstrate improved strength of R gluteus medius in sidelying to 4-/5 to aid in functional transfers.           Long-Term Goals: 6 Weeks      -  Patient will demonstrate improved R hip strength of at least 4+/5 to aid in functional transfers.      -  Patient will demonstrate 10 squats/sit <> stands with 10# and good form to aid in completion of ADLs.      -  Patient will demonstrate improved functional ability with Patient Specific Scale Score of at least 6.      -  Patient will demonstrate ability to ambulate up and down stairs with reciprocal pattern and 1 use of UEs to aid in community ambulation.      -  Patient will demonstrate improved SLS to at least 3 seconds to decrease risk of falling.       Plan:  Pt is advised to monitor symptoms and report back next visit.  She is to continue with HEP however gently and to stop with increased pain.  Will proceed based on effects of treatment    Total Session Time 30 and Timed code minutes 30  THERAPEUTIC EXERCISE 5 minutes and JOINT MOBILIZATION/MFR 25 minutes      Jaila Schellhorn, PTA  06/19/2023, 16:14

## 2023-06-21 ENCOUNTER — Ambulatory Visit (HOSPITAL_COMMUNITY)
Admission: RE | Admit: 2023-06-21 | Discharge: 2023-06-21 | Disposition: A | Discharge status: Still In Hospital | Payer: Medicaid Other | Source: Ambulatory Visit

## 2023-06-21 NOTE — PT Treatment (Signed)
[facility] Medicine [facility]  Outpatient Physical Therapy  [address]  [place], [address]  [phone]  (Fax) [phone]    Physical Therapy Treatment Note    Date: 06/21/2023  Patient's Name: Dominique Carrillo  Date of Birth: 03/11/1966  Physical Therapy Visit      Visit #/POC: 5 out of 12  Authorization: 9 (11/29 - 12/31)  POC Signed?: no  POC Ends: 1/10  Order Ends: n/a  Next Progress Note Due: POC        Evaluating Physical Therapist: Marcell Anger, PT, DPT  PT diagnosis/Reason for Referral: right hip pain  Next Scheduled Physician Appointment: TBD  Allergies/Contraindications: n/a            Subjective: Pt reports she is still doing well.  Notes pain is not nearly as severe.  Feels like treatment made 30% improvement on Monday.  Pt does admit she has not been back to work but reports she has been very active at home over past few days.  Pt states she has not taken Ibuprofen since Monday.  Rates pain today 4/10.  Notes just aggravating.     Objective:  Activities as noted below.  Did focus on MFR again today with some exercise however plan to resume exercise next visit     Measured ROM:     EXERCISE/ACTIVITY NAME REPETITIONS RESISTANCE COMPLETED THIS DOS   NuStep    5 minutes 4 Y   Psoas release R  Pectineus release  R  Hip adduction isometric  Hip abduction isometric         manual Y  Y  Y  y                       Seated hip IR    2x15 Red at ankles n   Bridges    15 Green n   Supine clamshells L/R  Supine hip march    12 each side  12 each side Green  Green N  n   Piriformis stretch (Knee to opposite shoulder)    30 seconds manual n   Side lying clamshell   Side lying hip abduction  Side lying reverse clamshell    15  15  15  Green  AROM  AROM N  N  n   Prone hip extension  Prone hip extension with knee flexed to 90  Prone hip IR    15  10     15  AROM  AROM     Red N  N     n   Table STS    12 No UE assist with staggered stance  n   Standing hip abduction alternating     Standing toe  tap on  step keeping level pelvis 12      10 Finger tip assist on // bars N      Y       Assessment: Pt tolerated this session very well.  She has good response to all releases and notes improvement with no pain in buttock after release and isometrics.  Pt has improved gait with reduced lateral trunk lean during ambulation    Short-Term Goals: 3 Weeks      -  Patient will demonstrate improved right hip AROM to at least 10 degrees extension to aid in ability to ambulate up and down stairs.      -  Patient will demonstrate independence with progressive HEP to maximize gains from  physical therapy.      -  Patient will report max 6/10 pain to aid in completion of ADLs/work duties.      -  Patient will demonstrate improved strength of R gluteus medius in sidelying to 4-/5 to aid in functional transfers.           Long-Term Goals: 6 Weeks      -  Patient will demonstrate improved R hip strength of at least 4+/5 to aid in functional transfers.      -  Patient will demonstrate 10 squats/sit <> stands with 10# and good form to aid in completion of ADLs.      -  Patient will demonstrate improved functional ability with Patient Specific Scale Score of at least 6.      -  Patient will demonstrate ability to ambulate up and down stairs with reciprocal pattern and 1 use of UEs to aid in community ambulation.      -  Patient will demonstrate improved SLS to at least 3 seconds to decrease risk of falling.          Plan:  Will monitor symptoms.  Plan to resume stabilization exercise next week.  Will provide release as needed    Total Session Time 28 and Timed code minutes 28  THERAPEUTIC EXERCISE 12 minutes and JOINT MOBILIZATION/MFR 16 minutes      Takia Runyon, PTA  06/21/2023, 16:19

## 2023-06-26 ENCOUNTER — Ambulatory Visit (HOSPITAL_COMMUNITY)
Admission: RE | Admit: 2023-06-26 | Discharge: 2023-06-26 | Disposition: A | Discharge status: Still In Hospital | Payer: Medicaid Other | Source: Ambulatory Visit

## 2023-06-26 NOTE — PT Treatment (Signed)
[facility] Medicine [facility]  Outpatient Physical Therapy  [address]  [place], [address]  [phone]  (Fax) [phone]    Physical Therapy Treatment Note    Date: 06/26/2023  Patient's Name: DYNASTY MEES  Date of Birth: 08/12/1965  Physical Therapy Visit          Visit #/POC: 6 out of 12  Authorization: 9 (11/29 - 12/31)  POC Signed?: no  POC Ends: 1/10  Order Ends: n/a  Next Progress Note Due: POC        Evaluating Physical Therapist: Marcell Anger, PT, DPT  PT diagnosis/Reason for Referral: right hip pain  Next Scheduled Physician Appointment: TBD  Allergies/Contraindications: n/a                Subjective:  Pt reports she is still doing well.  Has not taken OTC medicine since last week.  Reports worst pain over past week 6/10.  Notes she turned the wrong way at work and had pain.  Overall pt feels much better.  States she is doing more exercises and that seems to help.  Feels at least 75% better.      Objective:  Activities as noted below.  Did add some strengthening back to gym program today    Measured ROM:       EXERCISE/ACTIVITY NAME REPETITIONS RESISTANCE COMPLETED THIS DOS   NuStep    5 minutes 4 Y   Psoas release R  Pectineus release  R  Hip adduction isometric  Hip abduction isometric         manual N  N  N  N                         Seated hip IR    2x15 Red at ankles n   Bridges  Gluteal bridge    15   2 x 10 Green N  y   Supine clamshells L/R  Supine hip march    12 each side  12 each side Green  Green N  n   Piriformis stretch (Knee to opposite shoulder)    30 seconds manual n   Side lying clamshell   Side lying hip abduction  Side lying reverse clamshell    15  15  15  Green  AROM  AROM N  N  n   Prone hip extension  Prone hip extension with knee flexed to 90  Prone hip IR  Prone heel squeeze  Prone hip ant glide    10  10     15  5  sec x 10 AROM  AROM     Red    manual N  N     N  Y  y   Table STS  STS staggered stance    10   10 each No UE assist   Y  y   Standing  hip abduction alternating     Standing toe tap on  step keeping level pelvis 12        10 Finger tip assist on // bars N        Y             Assessment:  Pt tolerated treatment well.  She is responding nicely to treatment to Psoas and hip flexors.  Is able to do full bridge without pain.  Is able to do supine SLR and march without pain    Short-Term Goals: 3 Weeks      -  Patient will demonstrate improved right hip AROM to at least 10 degrees extension to aid in ability to ambulate up and down stairs.      -  Patient will demonstrate independence with progressive HEP to maximize gains from physical therapy.      -  Patient will report max 6/10 pain to aid in completion of ADLs/work duties.      -  Patient will demonstrate improved strength of R gluteus medius in sidelying to 4-/5 to aid in functional transfers.           Long-Term Goals: 6 Weeks      -  Patient will demonstrate improved R hip strength of at least 4+/5 to aid in functional transfers.      -  Patient will demonstrate 10 squats/sit <> stands with 10# and good form to aid in completion of ADLs.      -  Patient will demonstrate improved functional ability with Patient Specific Scale Score of at least 6.      -  Patient will demonstrate ability to ambulate up and down stairs with reciprocal pattern and 1 use of UEs to aid in community ambulation.      -  Patient will demonstrate improved SLS to at least 3 seconds to decrease risk of falling.          Plan: Will continue to progress pelvic, hip, and core strengthening as well as releases when necessary    Total Session Time 35 and Timed code minutes 35  THERAPEUTIC EXERCISE 35 minutes      Betha Shadix, PTA  06/26/2023, 16:24

## 2023-06-29 ENCOUNTER — Ambulatory Visit (HOSPITAL_COMMUNITY): Payer: Self-pay

## 2023-07-03 ENCOUNTER — Ambulatory Visit
Admission: RE | Admit: 2023-07-03 | Discharge: 2023-07-03 | Disposition: A | Discharge status: Still In Hospital | Payer: 59 | Source: Ambulatory Visit | Attending: Family Medicine | Admitting: Family Medicine

## 2023-07-03 NOTE — PT Treatment (Addendum)
[facility] Medicine [facility]  Outpatient Physical Therapy  [address]  [place], [address]  [phone]  (Fax) [phone]    Physical Therapy Treatment Note    Date: 07/03/2023  Patient's Name: Dominique Carrillo  Date of Birth: 10-22-1965  Physical Therapy Visit      Visit #/POC: 7 out of 12  Authorization: 9 (11/29 - 12/31)  POC Signed?: no  POC Ends: 1/10  Order Ends: n/a  Next Progress Note Due: POC        Evaluating Physical Therapist: Marcell Anger, PT, DPT  PT diagnosis/Reason for Referral: right hip pain  Next Scheduled Physician Appointment: TBD  Allergies/Contraindications: n/a      DISCHARGE NOTE  Patient did not follow up for reassessment appointment and will be discharged at this time.  Retrospective look at notes indicating patient was meeting or progressing toward all goals and patient-specific functional scale has returned to all activities without limitations.  Interventions have included therex and manual therapy for the treatment of right hip pain.  Date of service: 06/02/23 - 07/03/23.        Subjective:  Pt reports she is still doing well.  Notes she is about 90% better overall.  States she is doing exercises.  She has not taken any medicine in 2 weeks.  She is very pleased with her progress    Objective:  Activities as noted below.  Additions for stabilization    Measured ROM:        Patient-Specific Functional Score:     Problem Score Eval  07/03/23   1. Work duties 1  10   2. House ADLs 3  10   3. Stairs 2  10   Total 2  10   Total score = sum of the activity scores/number of activities    Minimal detectable change (90% CI) for avg score = 2 points    Minimal detectable change (90% CI) for single activity score = 3 points            EXERCISE/ACTIVITY NAME REPETITIONS RESISTANCE COMPLETED THIS DOS   NuStep    8 minutes 4 Y   Psoas release R  Pectineus release  R  Hip adduction isometric  Hip abduction isometric         manual N  N  N  N                          Seated  hip IR    2x15 Red at ankles n   Bridges  Gluteal bridge    15   2 x 10 Green N  n   Supine clamshells L/R  Supine hip march    12 each side  12 each side Green  Green N  n   Piriformis stretch (Knee to opposite shoulder)    30 seconds manual n   Side lying clamshell   Side lying hip abduction  Side lying reverse clamshell    15  15  15  Green  AROM  AROM N  N  n   Prone hip extension  Prone hip extension with knee flexed to 90  Prone hip IR  Prone heel squeeze  Prone hip ant glide    10  10     15  5  sec x 10 AROM  AROM     Red     manual N  N     N  N  n   Hip abduction machine   3 x 10 35# limited range y   Challenged stance A/P pull:   Normal BOS   Staggered stance       Y  Y                Table STS  STS staggered stance    10   10 each No UE assist   Y  y   Standing hip abduction alternating     Standing toe tap on  step keeping level pelvis 12        10 Finger tip assist on // bars N        n      Assessment:  Pt tolerated treatment well.  Challenged stance is difficult but tolerable.  Notes feels more stable after session today.  Pt has responded nicely to treatment so far    Short-Term Goals: 3 Weeks      -  Patient will demonstrate improved right hip AROM to at least 10 degrees extension to aid in ability to ambulate up and down stairs. (MET 07/03/23)     -  Patient will demonstrate independence with progressive HEP to maximize gains from physical therapy.  (07/03/23)     -  Patient will report max 6/10 pain to aid in completion of ADLs/work duties.  ( 5/10 on 07/03/23 MET)     -  Patient will demonstrate improved strength of R gluteus medius in sidelying to 4-/5 to aid in functional transfers.           Long-Term Goals: 6 Weeks      -  Patient will demonstrate improved R hip strength of at least 4+/5 to aid in functional transfers.      -  Patient will demonstrate 10 squats/sit <> stands with 10# and good form to aid in completion of ADLs.      -  Patient will demonstrate improved functional ability with  Patient Specific Scale Score of at least 6.      -  Patient will demonstrate ability to ambulate up and down stairs with reciprocal pattern and 1 use of UEs to aid in community ambulation.      -  Patient will demonstrate improved SLS to at least 3 seconds to decrease risk of falling.           Plan:  Will reassess goals next visit.  PT to reassess fully next week     Total Session Time 33 and Timed code minutes 33  THERAPEUTIC EXERCISE 33 minutes      Leisha Truitt, PTA  07/03/2023, 16:55

## 2023-07-06 ENCOUNTER — Other Ambulatory Visit: Payer: Self-pay

## 2023-07-07 ENCOUNTER — Ambulatory Visit (HOSPITAL_COMMUNITY): Payer: Self-pay

## 2023-07-11 ENCOUNTER — Ambulatory Visit (HOSPITAL_COMMUNITY): Payer: Self-pay

## 2023-07-13 ENCOUNTER — Ambulatory Visit (HOSPITAL_COMMUNITY): Payer: Self-pay

## 2023-07-18 ENCOUNTER — Encounter (HOSPITAL_COMMUNITY): Payer: Self-pay | Admitting: Surgery

## 2023-07-18 ENCOUNTER — Ambulatory Visit
Admission: RE | Admit: 2023-07-18 | Discharge: 2023-07-18 | Disposition: A | Discharge status: Home / Self Care | Payer: 59 | Source: Ambulatory Visit | Attending: Surgery | Admitting: Surgery

## 2023-07-18 ENCOUNTER — Encounter (HOSPITAL_COMMUNITY)
Admission: RE | Disposition: A | Discharge status: Home / Self Care | Payer: Self-pay | Source: Ambulatory Visit | Attending: Surgery

## 2023-07-18 ENCOUNTER — Other Ambulatory Visit: Payer: Self-pay

## 2023-07-18 ENCOUNTER — Ambulatory Visit (HOSPITAL_COMMUNITY): Payer: 59 | Admitting: Surgery

## 2023-07-18 ENCOUNTER — Ambulatory Visit (HOSPITAL_COMMUNITY): Payer: 59 | Admitting: Certified Registered"

## 2023-07-18 DIAGNOSIS — K2950 Unspecified chronic gastritis without bleeding: Secondary | ICD-10-CM | POA: Insufficient documentation

## 2023-07-18 DIAGNOSIS — Z7982 Long term (current) use of aspirin: Secondary | ICD-10-CM | POA: Insufficient documentation

## 2023-07-18 DIAGNOSIS — Z87891 Personal history of nicotine dependence: Secondary | ICD-10-CM | POA: Insufficient documentation

## 2023-07-18 DIAGNOSIS — Z6838 Body mass index (BMI) 38.0-38.9, adult: Secondary | ICD-10-CM | POA: Insufficient documentation

## 2023-07-18 DIAGNOSIS — K317 Polyp of stomach and duodenum: Secondary | ICD-10-CM | POA: Insufficient documentation

## 2023-07-18 DIAGNOSIS — R195 Other fecal abnormalities: Secondary | ICD-10-CM | POA: Insufficient documentation

## 2023-07-18 DIAGNOSIS — K449 Diaphragmatic hernia without obstruction or gangrene: Secondary | ICD-10-CM | POA: Insufficient documentation

## 2023-07-18 DIAGNOSIS — Z86718 Personal history of other venous thrombosis and embolism: Secondary | ICD-10-CM | POA: Insufficient documentation

## 2023-07-18 DIAGNOSIS — K219 Gastro-esophageal reflux disease without esophagitis: Secondary | ICD-10-CM | POA: Insufficient documentation

## 2023-07-18 DIAGNOSIS — E669 Obesity, unspecified: Secondary | ICD-10-CM | POA: Insufficient documentation

## 2023-07-18 DIAGNOSIS — K5730 Diverticulosis of large intestine without perforation or abscess without bleeding: Secondary | ICD-10-CM | POA: Insufficient documentation

## 2023-07-18 DIAGNOSIS — K648 Other hemorrhoids: Secondary | ICD-10-CM | POA: Insufficient documentation

## 2023-07-18 DIAGNOSIS — I10 Essential (primary) hypertension: Secondary | ICD-10-CM | POA: Insufficient documentation

## 2023-07-18 DIAGNOSIS — E039 Hypothyroidism, unspecified: Secondary | ICD-10-CM | POA: Insufficient documentation

## 2023-07-18 SURGERY — GASTROSCOPY WITH BIOPSY
Anesthesia: General | Wound class: Clean Contaminated Wounds-The respiratory, GI, Genital, or urinary

## 2023-07-18 MED ORDER — LACTATED RINGERS INTRAVENOUS SOLUTION
INTRAVENOUS | Status: DC | PRN
Start: 2023-07-18 — End: 2023-07-18

## 2023-07-18 MED ORDER — PROPOFOL 10 MG/ML INTRAVENOUS EMULSION
Freq: Once | INTRAVENOUS | Status: DC | PRN
Start: 2023-07-18 — End: 2023-07-18
  Administered 2023-07-18: 40 mL via INTRAVENOUS

## 2023-07-18 MED ORDER — LIDOCAINE (PF) 100 MG/5 ML (2 %) INTRAVENOUS SYRINGE
INJECTION | Freq: Once | INTRAVENOUS | Status: DC | PRN
Start: 2023-07-18 — End: 2023-07-18
  Administered 2023-07-18: 100 mg via INTRAVENOUS

## 2023-07-18 SURGICAL SUPPLY — 4 items
DETERGENT INSTR 22OZ TRNSPT GEL RINSE FREE NEUT PH PREKLENZ CLR PLSNT LF (MISCELLANEOUS PT CARE ITEMS) ×1 IMPLANT
FORCEPS BIOPSY MICROMESH TTH STREAMLINE CATH NEEDLE 240CM 2.4MM RJ 4 SS LRG CPC STRL DISP ORNG 2.8MM (ENDOSCOPIC SUPPLIES) ×1 IMPLANT
USE ITEM 60432 FORCEPS BIOPSY MICROMESH TTH STREAMLINE CATH NEEDLE 240CM 2.4MM RJ 4 SS LRG CPC STRL DISP ORNG 2.8MM (ENDOSCOPIC SUPPLIES) ×1 IMPLANT
VALVE AIR/H20 DEFENDO BUTTON KIT SUCT BIOPSY STRL DISP (ENDOSCOPIC SUPPLIES) ×1 IMPLANT

## 2023-07-18 NOTE — Nurses Notes (Signed)
 Dr Abbe Amsterdam in to speak with patient.

## 2023-07-18 NOTE — H&P (Signed)
General Surgery  History and Physical    Date of Service:    07/18/2023  Date of Admission:  07/18/2023  Date of Birth:  04-13-1966  PCP: Mickey Farber, DO    Reason for admission:  EGD and colonoscopy    HPI:  Dominique Carrillo is a 58 y.o. White female presenting for EGD and colonoscopy for history of positive Cologuard and gastroesophageal reflux disease  Past Medical History:   Diagnosis Date    DVT (deep venous thrombosis) (CMS HCC)     Left Calf    Hypothyroidism     Osteopenia     Tendonitis of wrist, right     Tinnitus of both ears     Vitamin D deficiency       Past Surgical History:   Procedure Laterality Date    COLONOSCOPY  08/27/2018    Dr. Abbe Amsterdam    ESOPHAGOGASTRODUODENOSCOPY  08/27/2018    Dr. Arley Phenix DILATION AND CURETTAGE      URETHRAL DILATION      when 58 years old      Social History     Tobacco Use    Smoking status: Former     Current packs/day: 0.00     Types: Cigarettes    Smokeless tobacco: Never   Vaping Use    Vaping status: Never Used   Substance Use Topics    Alcohol use: Not Currently     Comment: Holiday/special occasion.    Drug use: Never       Family Medical History:       Problem Relation (Age of Onset)    Elevated Lipids Mother, Father, Sister    Heart Attack Mother    Hypertension (High Blood Pressure) Mother, Father, Sister    Hypothyroidism Mother, Father, Sister    No Known Problems Brother, Maternal Grandmother, Maternal Grandfather, Paternal Grandmother, Paternal Grandfather, Daughter, Son, Maternal Aunt, Maternal Uncle, Paternal Aunt, Paternal Uncle, Other           Medications Prior to Admission       Prescriptions    aspirin 81 mg Oral Tablet, Chewable    Chew 1 Tablet (81 mg total) Once a day    CALCIUM 26-VIT D3-MAGNESIUM 15 ORAL    Take 2 Tablets by mouth Once a day 2 gummies daily    ergocalciferol, vitamin D2, (DRISDOL) 1,250 mcg (50,000 unit) Oral Capsule    Take 1 Capsule (50,000 Units total) by mouth Every 7 days for 180 days    famotidine (PEPCID) 40 mg Oral  Tablet    Take 1 Tablet (40 mg total) by mouth Twice daily for 180 days    hydrOXYzine pamoate (VISTARIL) 50 mg Oral Capsule    TAKE 1 CAPSULE BY MOUTH TWICE A DAY AS NEEDED    levothyroxine (SYNTHROID) 75 mcg Oral Tablet    Take 1 Tablet (75 mcg total) by mouth Every morning for 180 days    pantoprazole (PROTONIX) 40 mg Oral Tablet, Delayed Release (E.C.)    Take 1 Tablet (40 mg total) by mouth Twice daily Indications: gastroesophageal reflux disease, stomach ulcer from aspirin/ibuprofen-like drugs prevention    rosuvastatin (CRESTOR) 10 mg Oral Tablet    Take 1 Tablet (10 mg total) by mouth Once a day for 180 days    spironolacton-hydrochlorothiaz (ALDACTAZIDE) 25-25 mg Oral Tablet    Take 1 Tablet by mouth Every other day    sucralfate (CARAFATE) 1 gram Oral Tablet    Take 1 Tablet (  1 g total) by mouth Three times daily before meals           @ALLERGY    The above documented section regarding past medical, past surgical, family, and social history (PMFSH) has been reviewed and considered and to the best of my knowledge represents a valid and accurate reflection of the patient's previous pertinent experiences documented by multiple providers and participants of the EMR.I cannot attest to all entries but do no recognize any gross inaccuracies as the data is a common field across all providers  Further history pertinent to the current encounter will be found as referenced    Physical Exam:    Patient Vitals for the past 24 hrs:   BP Temp Pulse Resp SpO2 Height Weight   07/18/23 1115 (!) 151/83 -- 65 17 100 % -- --   07/18/23 1100 (!) 128/96 -- 70 15 98 % -- --   07/18/23 1045 127/87 -- 77 20 98 % -- --   07/18/23 1039 105/76 (!) 35.9 C (96.7 F) 87 16 98 % -- --   07/18/23 0937 137/78 36.4 C (97.6 F) 80 17 95 % 1.702 m (5\' 7" ) 111 kg (245 lb)          General: appropriate for age. in no acute distress.    HEENT: Atraumatic, Normocephalic.    Lungs: Nonlabored breathing with symmetric expansion    Heart:Regular wth  respect to rate and rythmn.    Abdomen:Soft. Nontender. Nondistended     Psychiatric: Alert and oriented to person, place, and time. affect appropriate    Laboratory Data:     No results found for any visits on 07/18/23 (from the past 24 hour(s)).    Imaging Studies:    Admission and follow-up radiographic findings were reviewed and noted by me at time of evaluation with appropriate decisions made regarding pertinent abnormalities as pertaining to the surgical evaluation and can be found in the patient's electronic medical record    Assessment:     Positive Cologuard   Gastroesophageal reflux disease    Plan:    Discussed indications, risks and benefits of colonoscopy and upper endoscopy with the patient.  Discussed the possibility of polypectomy, biopsies, and possible repeat examinations.  Risks include bleeding, sedation risks, possibility of missed diagnosis of polyp or malignancy, and remote possibilities of perforation and death.  All questions were answered and informed consent was clearly obtained    This note was partially created using voice recognition software and is inherently subject to errors including those of syntax and "sound alike " substitutions which may escape proof reading. In such instances, original meaning may be extrapolated by contextual derivation.    Clide Dales MD FACS RVT  [facility] -General Surgery

## 2023-07-18 NOTE — Anesthesia Postprocedure Evaluation (Signed)
Anesthesia Post Op Evaluation    Patient: Dominique Carrillo  Procedure(s):  EGD  WITH BIOPSY  COLONOSCOPY    Last Vitals:Temperature: (!) 35.9 C (96.7 F) (07/18/23 1039)  Heart Rate: 87 (07/18/23 1039)  BP (Non-Invasive): 105/76 (07/18/23 1039)  Respiratory Rate: 16 (07/18/23 1039)  SpO2: 98 % (07/18/23 1039)    No notable events documented.    Patient is sufficiently recovered from the effects of anesthesia to participate in the evaluation and has returned to their pre-procedure level.  Patient location during evaluation: PACU       Patient participation: complete - patient participated  Level of consciousness: awake and alert and responsive to verbal stimuli    Pain score: 0  Pain management: adequate  Airway patency: patent    Anesthetic complications: no  Cardiovascular status: acceptable  Respiratory status: acceptable  Hydration status: acceptable  Patient post-procedure temperature: Pt Normothermic   PONV Status: Absent

## 2023-07-18 NOTE — OR Surgeon (Signed)
[facility]    OPERATIVE NOTE    Patient Name: Dominique Carrillo, Dominique Carrillo MRN:: [id number]  Date of Birth: April 09, 1966  Date of Service: 07/18/2023     Pre-Operative Diagnosis: GERD  Positive Cologuard     Post-Operative Diagnosis: Gastritis  Scattered Gastric Polyps  Small Hiatal Hernia  Diverticulosis  Hemorrhoids    Procedure(s)/Description:  EGD  WITH BIOPSY: 43239 (CPT)  COLONOSCOPY: 54098 (CPT)     Attending Surgeon: Clide Dales, MD FACS RVT    Anesthesia Staff:  CRNA: Welton Flakes, CRNA    Anesthesia Type: .General     Estimated Blood Loss:  minimal    Specimens Removed:   ID Type Source Tests Collected by Time Destination   1 : Gastric Antrum Biopsy x1 Tissue Gastric SURGICAL PATHOLOGY SPECIMEN Clide Dales, MD 07/18/2023 1015       Order Name Source Comment Collection Info Order Time   SURGICAL PATHOLOGY SPECIMEN Gastric Pre-op diagnosis:  GERD  Positive Cologuard Collected By: Clide Dales, MD 07/18/2023 10:24 AM     Release to patient   Automated             Complications:  None immediate    Indications for procedure:    Dominique Carrillo, Dominique Carrillo is a 58 y.o. female who presents for  EGD and colonoscopy . Risks, benefits, indications, and complications of procedure were discussed in great detail and informed signed consent obtained.       Intraoperative Findings:     The Olympus gastroscope was brought to the operating field gently inserted into the patient's oropharynx and advanced to the level of the second and third portions of the duodenum under direct visualization without difficulty.  Once this anatomic landmark was reached the scope was slowly retracted with circumferential visualization of all upper intestinal walls with findings of gastritis, scattered gastric polyps, and a minimal hiatal hernia.  There was no evidence of gastric or duodenal ulcers or AV malformations.  Retroflexion of the scope revealed a minimal hiatal hernia.  The distal esophagus revealed no significant  esophagitis with an intact Z line and no evidence of Barrett's changes.  The remainder of the esophagus was within normal limits without evidence of esophageal stricture or mass.  No esophageal varices.         Once the upper endoscopy was performed the Olympus colonoscope was brought to the operating field gently inserted into the patient's rectum and advanced to the level of the cecum under direct visualization without difficulty.  Once this anatomic landmark was reached the scope was slowly retracted with circumferential visualization of all colonic and rectal walls with findings of internal hemorrhoids and sigmoid diverticulosis.  There was no evidence of colonic or rectal polyps, tumors or AV malformations.   The colonic and rectal mucosa revealed no significant abnormalities where visualized.  Retroflexion of the scope revealed the presence of internal hemorrhoids. The prep was overall adequate for diagnosstic exam however very small abnormalities may be missed given the nature of the exam.          Description of Procedure           The patient was brought to the Operating Suite and placed in the supine position on the operating table.  Anesthesia/nursing personnel provided IV access as well as hemodynamic monitoring.  After appropriate lines and leads were placed, the patient was placed in the left lateral decubitus position where they received total IV anesthesia.   After the  patient was deemed comfortable, the Olympus gastroscope was brought into the operative field, inserted into the patient's oropharynx, and advanced to the level of the esophagus where the esophagus was intubated without difficulty.  The scope was then passed down the esophagus into the stomach.  The stomach was insufflated with air followed by identification of the antrum of the stomach.  The pylorus of the stomach was identified followed by passage of the scope through the pylorus to the second and third portions of the duodenum  without difficulty.  Once this anatomic location had been reached, the scope was slowly retracted with circumferential visualization of all upper intestinal walls with findings as dictated above.  The scope was brought back to the antrum of the stomach where antral biopsy for Helicobacter pylori was accomplished followed by retroflexion of the scope to evaluate the fundus, body, and cardia of the stomach with findings as noted.  The scope was straightened and the stomach decompressed of as much air as possible.  The scope removed back to the GE junction which was inspected thoroughly.  The scope was then slowly retracted through the remainder of the esophagus.             After completion of the upper endoscopy, attention was directed to the perineum where the Olympus colonoscope was brought to the operative field, gently inserted into the patient's rectum, and advanced to the level of the cecum under direct visualization with identification of the cecum through the use of normal anatomic landmarks.   Once the cecum was clearly identified, the scope was slowly retracted with circumferential visualization of all colonic and rectal walls with findings dictated above. In the rectum  the scope was retroflexed to evaluate the anorectal junction for the presence of hemorrhoids.  The scope was slowly straightened, the colon decompressed of as much air as possible and removed without difficulty.  The patient was returned to the post anesthesia care unit in stable condition, having tolerated the procedure well.           Disposition:     Repeat colonoscopy in 10 years barring any change in symptoms    Clide Dales MD FACS RVT  [facility] -General Surgery

## 2023-07-18 NOTE — Discharge Instructions (Addendum)
SURGICAL DISCHARGE INSTRUCTIONS     Dr. Abbe Amsterdam, Ladona Horns, MD  performed your EGD  WITH BIOPSY, COLONOSCOPY today at the [facility]    [facility]:  Monday through Friday from 8 a.m. - 4 p.m.: [phone]    For T&D: [phone]  Between 4 p.m. - 8 a.m., weekends and holidays:  Call ER [phone]    PLEASE SEE WRITTEN HANDOUTS AS DISCUSSED BY YOUR NURSE:  Olegario Messier      ANESTHESIA INFORMATION   ANESTHESIA -- ADULT PATIENTS:  You have received intravenous sedation / general anesthesia, and you may feel drowsy and light-headed for several hours. You may even experience some forgetfulness of the procedure. DO NOT DRIVE A MOTOR VEHICLE or perform any activity requiring complete alertness or coordination until you feel fully awake in about 24-48 hours. Do not drink alcoholic beverages for at least 24 hours. Do not stay alone, you must have a responsible adult available to be with you. You may also experience a dry mouth or nausea for 24 hours. This is a normal side effect and will disappear as the effects of the medication wear off.    REMEMBER   If you experience any difficulty breathing, chest pain, bleeding that you feel is excessive, persistent nausea or vomiting or for any other concerns:  Call your physician Dr.  Abbe Amsterdam, Ladona Horns, MD   at [phone] . You may also ask to have the general doctor on call paged. They are available to you 24 hours a day.      SPECIAL INSTRUCTIONS / COMMENTS   No driving, operating heavy equipment, signing of legal documents or cooking for 24 hours.  High fiber diet.  Change positions slowly.  Increase fluid intake today and gradually advance diet.      FOLLOW-UP APPOINTMENTS   Please call your surgeon's office at the number listed to schedule a date / time of return for follow-up. Follow up in 5-10 years for repeat colonoscopy.    Dr Whitney Muse [phone]

## 2023-07-18 NOTE — Nurses Notes (Signed)
Ambulated to bathroom to dress without complaints of dizziness.  Will DC after speaking with Dr Abbe Amsterdam.

## 2023-07-18 NOTE — Anesthesia Preprocedure Evaluation (Signed)
ANESTHESIA PRE-OP EVALUATION  Planned Procedure: EGD  WITH BIOPSY  COLONOSCOPY  Review of Systems     anesthesia history negative               Pulmonary  negative pulmonary ROS,    Cardiovascular    Hypertension, ECG reviewed and DVT ,No peripheral edema,  Exercise Tolerance: > or = 4 METS        GI/Hepatic/Renal    GERD        Endo/Other    ASA, hypothyroidism, obesity and drug induced coagulopathy,      Neuro/Psych/MS   negative neuro/psych ROS,      Cancer    negative hematology/oncology ROS,               Physical Assessment      Airway       Mallampati: II    TM distance: <3 FB    Neck ROM: full  Mouth Opening: good.  No Facial hair          Dental           (+) poor dentition           Pulmonary    Breath sounds clear to auscultation  (-) no rhonchi, no decreased breath sounds, no wheezes, no rales and no stridor     Cardiovascular    Rhythm: regular  Rate: Normal  (-) no friction rub, carotid bruit is not present, no peripheral edema and no murmur     Other findings          Plan  ASA 2     Planned anesthesia type: general     total intravenous anesthesia                          Anesthetic plan and risks discussed with patient  signed consent obtained          Patient's NPO status is appropriate for Anesthesia.

## 2023-07-18 NOTE — Nurses Notes (Signed)
Sitting up drinking clear fluids without complaints.

## 2023-07-18 NOTE — OR PostOp (Signed)
Sitting up in bed without complaints.

## 2023-07-19 ENCOUNTER — Telehealth (INDEPENDENT_AMBULATORY_CARE_PROVIDER_SITE_OTHER): Payer: Self-pay | Admitting: Surgery

## 2023-07-19 DIAGNOSIS — K2950 Unspecified chronic gastritis without bleeding: Secondary | ICD-10-CM

## 2023-07-19 LAB — SURGICAL PATHOLOGY SPECIMEN

## 2023-08-25 ENCOUNTER — Other Ambulatory Visit (INDEPENDENT_AMBULATORY_CARE_PROVIDER_SITE_OTHER): Payer: Self-pay | Admitting: Family Medicine

## 2023-10-02 ENCOUNTER — Ambulatory Visit (INDEPENDENT_AMBULATORY_CARE_PROVIDER_SITE_OTHER)

## 2023-10-02 ENCOUNTER — Other Ambulatory Visit: Attending: Family Medicine | Admitting: Family Medicine

## 2023-10-02 ENCOUNTER — Other Ambulatory Visit: Payer: Self-pay

## 2023-10-02 DIAGNOSIS — I10 Essential (primary) hypertension: Secondary | ICD-10-CM | POA: Insufficient documentation

## 2023-10-02 LAB — BASIC METABOLIC PANEL
ANION GAP: 14 mmol/L — ABNORMAL HIGH (ref 4–13)
BUN/CREA RATIO: 16 (ref 6–22)
BUN: 16 mg/dL (ref 7–25)
CALCIUM: 9.3 mg/dL (ref 8.6–10.3)
CHLORIDE: 105 mmol/L (ref 98–107)
CO2 TOTAL: 29 mmol/L (ref 21–31)
CREATININE: 0.97 mg/dL (ref 0.60–1.30)
ESTIMATED GFR: 69 mL/min/{1.73_m2} (ref >59)
GLUCOSE: 115 mg/dL — ABNORMAL HIGH (ref 74–109)
OSMOLALITY, CALCULATED: 296 mosm/kg — ABNORMAL HIGH (ref 270–290)
POTASSIUM: 4.4 mmol/L (ref 3.5–5.1)
SODIUM: 148 mmol/L — ABNORMAL HIGH (ref 136–145)

## 2023-10-02 LAB — URINALYSIS, MACROSCOPIC
BILIRUBIN: NEGATIVE mg/dL
BLOOD: NEGATIVE mg/dL
GLUCOSE: NEGATIVE mg/dL
KETONES: NEGATIVE mg/dL
LEUKOCYTES: 75 WBCs/uL — AB
NITRITE: NEGATIVE
PH: 6 (ref 5.0–9.0)
PROTEIN: NEGATIVE mg/dL
SPECIFIC GRAVITY: 1.021 (ref 1.002–1.030)
UROBILINOGEN: NORMAL mg/dL

## 2023-10-02 LAB — HEPATIC FUNCTION PANEL
ALBUMIN/GLOBULIN RATIO: 1.4 (ref 0.8–1.4)
ALBUMIN: 4.3 g/dL (ref 3.5–5.7)
ALKALINE PHOSPHATASE: 96 U/L (ref 34–104)
ALT (SGPT): 22 U/L (ref 7–52)
AST (SGOT): 20 U/L (ref 13–39)
BILIRUBIN DIRECT: 0.08 md/dL (ref 0.03–0.18)
BILIRUBIN TOTAL: 0.6 mg/dL (ref 0.3–1.0)
BILIRUBIN, INDIRECT: 0.52 mg/dL (ref <=1)
GLOBULIN: 3 (ref 2.0–3.5)
PROTEIN TOTAL: 7.3 g/dL (ref 6.4–8.9)

## 2023-10-02 LAB — CBC WITH DIFF
BASOPHIL #: 0 10*3/uL (ref 0.00–0.10)
BASOPHIL %: 1 % (ref 0–1)
EOSINOPHIL #: 0.2 10*3/uL (ref 0.00–0.50)
EOSINOPHIL %: 4 % (ref 1–7)
HCT: 38.4 % (ref 31.2–41.9)
HGB: 13.2 g/dL (ref 10.9–14.3)
LYMPHOCYTE #: 1.1 10*3/uL (ref 1.10–3.10)
LYMPHOCYTE %: 26 % (ref 16–46)
MCH: 31 pg (ref 24.7–32.8)
MCHC: 34.3 g/dL (ref 32.3–35.6)
MCV: 90.5 fL (ref 75.5–95.3)
MONOCYTE #: 0.3 10*3/uL (ref 0.20–0.90)
MONOCYTE %: 7 % (ref 4–11)
MPV: 7.3 fL — ABNORMAL LOW (ref 7.9–10.8)
NEUTROPHIL #: 2.8 10*3/uL (ref 1.90–8.20)
NEUTROPHIL %: 62 % (ref 43–77)
PLATELETS: 248 10*3/uL (ref 140–440)
RBC: 4.24 10*6/uL (ref 3.63–4.92)
RDW: 13.5 % (ref 12.3–17.7)
WBC: 4.5 10*3/uL (ref 3.8–11.8)

## 2023-10-02 LAB — URINALYSIS, MICROSCOPIC
RBCS: 1 /HPF (ref <4)
SQUAMOUS EPITHELIAL: 3 /HPF (ref <28)
WBCS: 5 /HPF (ref <6)

## 2023-10-02 LAB — LIPID PANEL
CHOL/HDL RATIO: 2.7
CHOLESTEROL: 165 mg/dL (ref <200)
HDL CHOL: 61 mg/dL (ref >=40)
LDL CALC: 83 mg/dL (ref 0–100)
TRIGLYCERIDES: 105 mg/dL (ref <=150)
VLDL CALC: 21 mg/dL (ref 0–50)

## 2023-10-02 LAB — THYROID STIMULATING HORMONE (SENSITIVE TSH): TSH: 5.456 u[IU]/mL — ABNORMAL HIGH (ref 0.450–5.330)

## 2023-10-03 ENCOUNTER — Ambulatory Visit (INDEPENDENT_AMBULATORY_CARE_PROVIDER_SITE_OTHER): Payer: Self-pay

## 2023-10-03 ENCOUNTER — Other Ambulatory Visit (INDEPENDENT_AMBULATORY_CARE_PROVIDER_SITE_OTHER): Payer: Self-pay | Admitting: Family Medicine

## 2023-10-03 MED ORDER — LEVOTHYROXINE 100 MCG TABLET
100.0000 ug | ORAL_TABLET | Freq: Every morning | ORAL | 1 refills | Status: DC
Start: 2023-10-03 — End: 2024-03-27

## 2023-10-04 ENCOUNTER — Ambulatory Visit (INDEPENDENT_AMBULATORY_CARE_PROVIDER_SITE_OTHER): Payer: Self-pay | Admitting: Family Medicine

## 2023-10-04 ENCOUNTER — Other Ambulatory Visit: Payer: Self-pay

## 2023-10-04 ENCOUNTER — Encounter (INDEPENDENT_AMBULATORY_CARE_PROVIDER_SITE_OTHER): Payer: Self-pay | Admitting: Family Medicine

## 2023-10-04 VITALS — BP 147/89 | HR 74 | Temp 97.9°F | Resp 20 | Ht 67.0 in | Wt 262.0 lb

## 2023-10-04 DIAGNOSIS — E559 Vitamin D deficiency, unspecified: Secondary | ICD-10-CM

## 2023-10-04 DIAGNOSIS — R7303 Prediabetes: Secondary | ICD-10-CM

## 2023-10-04 DIAGNOSIS — E039 Hypothyroidism, unspecified: Secondary | ICD-10-CM

## 2023-10-04 DIAGNOSIS — E782 Mixed hyperlipidemia: Secondary | ICD-10-CM

## 2023-10-04 DIAGNOSIS — R5383 Other fatigue: Secondary | ICD-10-CM

## 2023-10-04 DIAGNOSIS — I10 Essential (primary) hypertension: Secondary | ICD-10-CM

## 2023-10-04 DIAGNOSIS — K219 Gastro-esophageal reflux disease without esophagitis: Secondary | ICD-10-CM

## 2023-10-04 LAB — URINE CULTURE,ROUTINE: URINE CULTURE: 50000 — AB

## 2023-10-04 MED ORDER — DEXAMETHASONE SODIUM PHOSPHATE 4 MG/ML INJECTION SOLUTION
4.0000 mg | INTRAMUSCULAR | Status: AC
Start: 2023-10-04 — End: 2023-10-04
  Administered 2023-10-04: 4 mg via INTRAMUSCULAR

## 2023-10-04 MED ORDER — METHYLPREDNISOLONE ACETATE 40 MG/ML SUSPENSION FOR INJECTION
40.0000 mg | INTRAMUSCULAR | Status: AC
Start: 2023-10-04 — End: 2023-10-04
  Administered 2023-10-04: 40 mg via INTRAMUSCULAR

## 2023-10-04 MED ORDER — CEFDINIR 300 MG CAPSULE
300.0000 mg | ORAL_CAPSULE | Freq: Two times a day (BID) | ORAL | 0 refills | Status: AC
Start: 2023-10-04 — End: 2023-10-14

## 2023-10-04 NOTE — Nursing Note (Signed)
 10/04/23 1617   Depression Screen   Little interest or pleasure in doing things. 0   Feeling down, depressed, or hopeless 0   PHQ 2 Total 0

## 2023-10-04 NOTE — Nursing Note (Signed)
 10/04/23 1616   Recent Weight Change   Have you had a recent unexplained weight loss or gain? N   Domestic Violence   Because we are aware of abuse and domestic violence today, we ask all patients: Are you being hurt, hit, or frightened by anyone at your home or in your life?  N   Basic Needs   Do you have any basic needs within your home that are not being met? (such as Food, Shelter, Civil Service fast streamer, Tranportation, paying for bills and/or medications) N   Advanced Directives   Do you have any advanced directives? No Advance   Would you like an advanced directive packet? Accepted Packet

## 2023-10-04 NOTE — Progress Notes (Signed)
 FAMILY MEDICINE, MEDICAL OFFICE BUILDING  [address]  [place] [address]       Name: Dominique Carrillo MRN:  [id number]   Date: 10/04/2023 Age: 58 y.o.          Provider: Jamison Mccreedy, DO    Reason for visit: Follow Up 6 Months      History of Present Illness:  10/04/2023:  This 58 year old female returns for six-month follow-up having some mild hot flashes with increased heart rate.  She also has an elevated TSH so were going to increase her levothyroxine  to 100 mcg.  She had a colonoscopy and EGD which showed gastric polyps hiatal hernia diverticulosis and hemorrhoids.  Her Cologuard was positive on 04/13/2023.  She complains of no energy in his hoping that the increase in levothyroxine  will help.  She complains of a sinus infection headache present for a month instead every urine is spring and fall left eyes are watering she has been taking Advil cold and sinus she has had a 15 lb weight gain since she was here last.  She is do a Pneumovax 23 shingles vaccine a annual wellness visit.  147/89 she needs to check it more frequently at home.  Her white count was 4500 hemoglobin 13.2 hematocrit 38.4 sodium was elevated at 148 and I told her to be better hydrated next time glucose was 115 BUN 16 creatinine 0.97 anion gap was slightly elevated at 14 globulin was normal at 3.0 total cholesterol was 165 HDL 61 LDL 77 triglycerides dropped to 105 TSH was 5.456 liver enzymes are normal urinalysis was normal little bit leukocytes but contamination grew out of the urine.  At this time she denies chest pain shortness for breath nausea vomiting diarrhea constipation  Historical Data    Past Medical History:  Past Medical History:   Diagnosis Date    DVT (deep venous thrombosis)     Left Calf    Hypothyroidism     Osteopenia     Tendonitis of wrist, right     Tinnitus of both ears     Vitamin D  deficiency          Past Surgical History:  Past Surgical History:   Procedure Laterality Date    COLONOSCOPY  08/27/2018     Dr. Wayne Haines    ESOPHAGOGASTRODUODENOSCOPY  08/27/2018    Dr. Niel Barrs DILATION AND CURETTAGE      URETHRAL DILATION      when 58 years old         Allergies:  Allergies   Allergen Reactions    Amoxicillin  Other Adverse Reaction (Add comment)     Mouth Blisters/Swelling    Augmentin [Amoxicillin-Pot Clavulanate]      Blisters in mouth    Propoxyphene Nausea/ Vomiting     Medications:  Current Outpatient Medications   Medication Sig    aspirin 81 mg Oral Tablet, Chewable Chew 1 Tablet (81 mg total) Daily    CALCIUM 26-VIT D3-MAGNESIUM 15 ORAL Take 2 Tablets by mouth Once a day 2 gummies daily    cefdinir  (OMNICEF ) 300 mg Oral Capsule Take 1 Capsule (300 mg total) by mouth Twice daily for 10 days    famotidine  (PEPCID ) 40 mg Oral Tablet Take 1 Tablet (40 mg total) by mouth Twice daily for 180 days    hydrOXYzine  pamoate (VISTARIL ) 50 mg Oral Capsule TAKE 1 CAPSULE BY MOUTH TWICE A DAY AS NEEDED    levothyroxine  (SYNTHROID ) 100 mcg Oral Tablet  Take 1 Tablet (100 mcg total) by mouth Every morning    pantoprazole  (PROTONIX ) 40 mg Oral Tablet, Delayed Release (E.C.) Take 1 Tablet (40 mg total) by mouth Twice daily Indications: gastroesophageal reflux disease, stomach ulcer from aspirin/ibuprofen-like drugs prevention    rosuvastatin  (CRESTOR ) 10 mg Oral Tablet Take 1 Tablet (10 mg total) by mouth Once a day for 180 days    spironolacton-hydrochlorothiaz (ALDACTAZIDE ) 25-25 mg Oral Tablet TAKE 1 TABLET BY MOUTH EVERY OTHER DAY    sucralfate  (CARAFATE ) 1 gram Oral Tablet Take 1 Tablet (1 g total) by mouth Three times daily before meals     Family History:  Family Medical History:       Problem Relation (Age of Onset)    Elevated Lipids Mother, Father, Sister    Heart Attack Mother    Hypertension (High Blood Pressure) Mother, Father, Sister    Hypothyroidism Mother, Father, Sister    No Known Problems Brother, Maternal Grandmother, Maternal Grandfather, Paternal Grandmother, Paternal Grandfather, Daughter, Son, Maternal  Aunt, Maternal Uncle, Paternal Aunt, Paternal Education officer, community, Other            Social History:  Social History     Socioeconomic History    Marital status: Single   Tobacco Use    Smoking status: Former     Types: Cigarettes    Smokeless tobacco: Never   Vaping Use    Vaping status: Never Used   Substance and Sexual Activity    Alcohol use: Not Currently     Comment: Holiday/special occasion.    Drug use: Never     Social Determinants of Health     Social Connections: Medium Risk (02/12/2023)    Social Connections     SDOH Social Isolation: 3 to 5 times a week           Review of Systems:  Any pertinent Review of Systems as addressed in the HPI above.    Physical Exam:  Vital Signs:  Vitals:    10/04/23 1622   BP: (!) 147/89   Pulse: 74   Resp: 20   Temp: 36.6 C (97.9 F)   TempSrc: Temporal   SpO2: 100%   Weight: 119 kg (262 lb)   Height: 1.702 m (5\' 7" )   BMI: 41.04     Physical Exam  Vitals and nursing note reviewed.   Constitutional:       General: She is awake.      Appearance: Normal appearance. She is well-developed and well-groomed. She is morbidly obese.   HENT:      Head: Normocephalic and atraumatic.      Right Ear: Tympanic membrane, ear canal and external ear normal.      Left Ear: Tympanic membrane, ear canal and external ear normal.      Nose: Nose normal.      Mouth/Throat:      Mouth: Mucous membranes are moist.      Pharynx: Oropharynx is clear.   Eyes:      Extraocular Movements: Extraocular movements intact.      Conjunctiva/sclera: Conjunctivae normal.      Pupils: Pupils are equal, round, and reactive to light.   Cardiovascular:      Rate and Rhythm: Normal rate and regular rhythm.      Pulses:           Carotid pulses are 2+ on the right side and 2+ on the left side.       Radial pulses are 2+ on  the right side and 2+ on the left side.        Dorsalis pedis pulses are 2+ on the right side and 2+ on the left side.        Posterior tibial pulses are 2+ on the right side and 2+ on the left side.       Heart sounds: Normal heart sounds, S1 normal and S2 normal.   Pulmonary:      Effort: Pulmonary effort is normal.      Breath sounds: Normal breath sounds.   Abdominal:      General: Abdomen is protuberant. Bowel sounds are normal.      Palpations: Abdomen is soft.      Tenderness: There is no abdominal tenderness. There is no right CVA tenderness or left CVA tenderness.   Musculoskeletal:         General: Normal range of motion.      Cervical back: Normal range of motion and neck supple.      Right lower leg: No edema.      Left lower leg: No edema.   Skin:     General: Skin is warm and dry.      Capillary Refill: Capillary refill takes less than 2 seconds.   Neurological:      General: No focal deficit present.      Mental Status: She is alert and oriented to person, place, and time. Mental status is at baseline.      Cranial Nerves: Cranial nerves 2-12 are intact.      Sensory: Sensation is intact.      Motor: Motor function is intact.      Coordination: Coordination is intact.      Gait: Gait is intact.   Psychiatric:         Mood and Affect: Mood normal.         Behavior: Behavior normal. Behavior is cooperative.         Thought Content: Thought content normal.         Judgment: Judgment normal.       Assessment:    ICD-10-CM    1. Fracture of sacrum  S32.10XA       2. Acquired hypothyroidism  E03.9       3. Essential hypertension  I10 LIPID PANEL     HEPATIC FUNCTION PANEL     CBC/DIFF     URINALYSIS, MACROSCOPIC AND MICROSCOPIC W/CULTURE REFLEX     BASIC METABOLIC PANEL     THYROID  STIMULATING HORMONE (SENSITIVE TSH)      4. Hyperlipidemia, mixed  E78.2       5. Gastroesophageal reflux disease without esophagitis  K21.9       6. Pre-diabetes  R73.03       7. Fatigue, unspecified type  R53.83       8. Vitamin D  deficiency  E55.9          Plan:  Orders Placed This Encounter    LIPID PANEL    HEPATIC FUNCTION PANEL    CBC/DIFF    URINALYSIS, MACROSCOPIC AND MICROSCOPIC W/CULTURE REFLEX    BASIC METABOLIC PANEL     THYROID  STIMULATING HORMONE (SENSITIVE TSH)    methylPREDNISolone  acetate (DEPO-medrol ) 40 mg/mL injection    dexAMETHasone  4 mg/mL injection    cefdinir  (OMNICEF ) 300 mg Oral Capsule     Her fracture of her sacrum has finally healed.  She requested a steroid injection so we will give her 4 mg of dexamethasone  and  40 mg of Depo-Medrol  and I am going to start her on Omnicef  300.  For 6 months from now I have ordered a lipid panel hepatic function panel CBC with diff urinalysis basic metabolic panel thyroid -stimulating hormone the patient does not seem to be interested in hormone replacement therapy at this time but will change her mind maybe in the future after she has been on levothyroxine  100 her blood pressure is elevated and she is going to keep a record of her blood pressures at home and bring it in his next time she comes in.  Low back pain is better fracture of the sacrum is better.  Her acid reflux is well controlled stable with Pepcid  twice a day pantoprazole  twice a day and Carafate  1 g 3 times daily.  More than 50% of the visit was spent counseling and coordinating patient care.  All questions were answered to the satisfaction of the patient.  The patient will stay on vitamin-D for vitamin-D deficiency.    Return in about 6 months (around 04/04/2024).    Jamison Mccreedy, DO     Portions of this note may be dictated using voice recognition software or a dictation service. Variances in spelling and vocabulary are possible and unintentional. Not all errors are caught/corrected. Please notify the [facility] if any discrepancies are noted or if the meaning of any statement is not clear.

## 2023-11-20 ENCOUNTER — Other Ambulatory Visit (INDEPENDENT_AMBULATORY_CARE_PROVIDER_SITE_OTHER): Payer: Self-pay | Admitting: Family Medicine

## 2023-12-14 ENCOUNTER — Other Ambulatory Visit (INDEPENDENT_AMBULATORY_CARE_PROVIDER_SITE_OTHER): Payer: Self-pay | Admitting: Family Medicine

## 2023-12-18 ENCOUNTER — Other Ambulatory Visit (INDEPENDENT_AMBULATORY_CARE_PROVIDER_SITE_OTHER): Payer: Self-pay | Admitting: Family Medicine

## 2023-12-18 DIAGNOSIS — I10 Essential (primary) hypertension: Secondary | ICD-10-CM

## 2023-12-26 ENCOUNTER — Other Ambulatory Visit (INDEPENDENT_AMBULATORY_CARE_PROVIDER_SITE_OTHER): Payer: Self-pay | Admitting: Family Medicine

## 2024-01-12 ENCOUNTER — Other Ambulatory Visit (INDEPENDENT_AMBULATORY_CARE_PROVIDER_SITE_OTHER): Payer: Self-pay | Admitting: Family Medicine

## 2024-01-16 ENCOUNTER — Other Ambulatory Visit (INDEPENDENT_AMBULATORY_CARE_PROVIDER_SITE_OTHER): Payer: Self-pay | Admitting: Family Medicine

## 2024-02-21 ENCOUNTER — Other Ambulatory Visit (INDEPENDENT_AMBULATORY_CARE_PROVIDER_SITE_OTHER): Payer: Self-pay | Admitting: Family Medicine

## 2024-02-21 DIAGNOSIS — Z1231 Encounter for screening mammogram for malignant neoplasm of breast: Secondary | ICD-10-CM

## 2024-03-07 ENCOUNTER — Encounter (HOSPITAL_COMMUNITY): Payer: Self-pay

## 2024-03-07 ENCOUNTER — Ambulatory Visit (INDEPENDENT_AMBULATORY_CARE_PROVIDER_SITE_OTHER): Payer: Self-pay

## 2024-03-07 ENCOUNTER — Ambulatory Visit
Admission: RE | Admit: 2024-03-07 | Discharge: 2024-03-07 | Disposition: A | Discharge status: Home / Self Care | Payer: Self-pay | Source: Ambulatory Visit | Attending: Family Medicine | Admitting: Family Medicine

## 2024-03-07 ENCOUNTER — Other Ambulatory Visit: Payer: Self-pay

## 2024-03-07 DIAGNOSIS — Z23 Encounter for immunization: Secondary | ICD-10-CM | POA: Insufficient documentation

## 2024-03-07 DIAGNOSIS — Z1231 Encounter for screening mammogram for malignant neoplasm of breast: Secondary | ICD-10-CM | POA: Insufficient documentation

## 2024-03-21 ENCOUNTER — Other Ambulatory Visit (INDEPENDENT_AMBULATORY_CARE_PROVIDER_SITE_OTHER): Payer: Self-pay | Admitting: Family Medicine

## 2024-03-27 ENCOUNTER — Other Ambulatory Visit (INDEPENDENT_AMBULATORY_CARE_PROVIDER_SITE_OTHER): Payer: Self-pay | Admitting: Family Medicine

## 2024-04-03 ENCOUNTER — Other Ambulatory Visit (INDEPENDENT_AMBULATORY_CARE_PROVIDER_SITE_OTHER): Payer: Self-pay

## 2024-04-05 ENCOUNTER — Other Ambulatory Visit: Payer: Self-pay

## 2024-04-05 ENCOUNTER — Ambulatory Visit: Payer: Self-pay | Attending: Family Medicine | Admitting: Family Medicine

## 2024-04-05 ENCOUNTER — Encounter (INDEPENDENT_AMBULATORY_CARE_PROVIDER_SITE_OTHER): Payer: Self-pay | Admitting: Family Medicine

## 2024-04-05 VITALS — BP 114/80 | HR 84 | Temp 97.7°F | Resp 18 | Ht 67.0 in | Wt 252.0 lb

## 2024-04-05 DIAGNOSIS — N644 Mastodynia: Secondary | ICD-10-CM | POA: Insufficient documentation

## 2024-04-05 DIAGNOSIS — E039 Hypothyroidism, unspecified: Secondary | ICD-10-CM | POA: Insufficient documentation

## 2024-04-05 DIAGNOSIS — G8929 Other chronic pain: Secondary | ICD-10-CM | POA: Insufficient documentation

## 2024-04-05 DIAGNOSIS — R7303 Prediabetes: Secondary | ICD-10-CM | POA: Insufficient documentation

## 2024-04-05 DIAGNOSIS — K219 Gastro-esophageal reflux disease without esophagitis: Secondary | ICD-10-CM | POA: Insufficient documentation

## 2024-04-05 DIAGNOSIS — E876 Hypokalemia: Secondary | ICD-10-CM | POA: Insufficient documentation

## 2024-04-05 DIAGNOSIS — M545 Low back pain, unspecified: Secondary | ICD-10-CM | POA: Insufficient documentation

## 2024-04-05 DIAGNOSIS — E559 Vitamin D deficiency, unspecified: Secondary | ICD-10-CM | POA: Insufficient documentation

## 2024-04-05 DIAGNOSIS — I10 Essential (primary) hypertension: Secondary | ICD-10-CM | POA: Insufficient documentation

## 2024-04-05 DIAGNOSIS — E782 Mixed hyperlipidemia: Secondary | ICD-10-CM | POA: Insufficient documentation

## 2024-04-05 DIAGNOSIS — G540 Brachial plexus disorders: Secondary | ICD-10-CM | POA: Insufficient documentation

## 2024-04-05 DIAGNOSIS — Z87891 Personal history of nicotine dependence: Secondary | ICD-10-CM

## 2024-04-05 DIAGNOSIS — E669 Obesity, unspecified: Secondary | ICD-10-CM | POA: Insufficient documentation

## 2024-04-05 MED ORDER — ERGOCALCIFEROL (VITAMIN D2) 1,250 MCG (50,000 UNIT) CAPSULE
50000.0000 [IU] | ORAL_CAPSULE | ORAL | 1 refills | Status: AC
Start: 2024-04-05 — End: 2024-10-02

## 2024-04-05 MED ORDER — FAMOTIDINE 40 MG TABLET
40.0000 mg | ORAL_TABLET | Freq: Two times a day (BID) | ORAL | 1 refills | Status: AC
Start: 2024-04-05 — End: ?

## 2024-04-05 MED ORDER — ROSUVASTATIN 10 MG TABLET
10.0000 mg | ORAL_TABLET | Freq: Every day | ORAL | 1 refills | Status: AC
Start: 2024-04-05 — End: ?

## 2024-04-05 MED ORDER — SPIRONOLACTONE 25 MG-HYDROCHLOROTHIAZIDE 25 MG TABLET
1.0000 | ORAL_TABLET | ORAL | 1 refills | Status: AC
Start: 2024-04-05 — End: 2024-10-02

## 2024-04-05 MED ORDER — PANTOPRAZOLE 40 MG TABLET,DELAYED RELEASE: 40.0000 mg | DELAYED_RELEASE_TABLET | Freq: Every day | ORAL | 0 refills | Status: DC

## 2024-04-05 MED ORDER — HYDROXYZINE PAMOATE 50 MG CAPSULE
50.0000 mg | ORAL_CAPSULE | Freq: Two times a day (BID) | ORAL | 1 refills | Status: AC | PRN
Start: 2024-04-05 — End: ?

## 2024-04-05 NOTE — Progress Notes (Signed)
 FAMILY MEDICINE, MEDICAL OFFICE BUILDING  [address]  [place] [address]  Operated by [facility]     Name: Dominique Carrillo MRN:  [id number]   Date: 04/05/2024 Age: 58 y.o.          Provider: Layman Law, DO    Reason for visit: Follow Up 6 Months      History of Present Illness:  04/05/2024:  This 59 year old female returns for six-month follow-up to review her labs and get refills on all her medications she states she forgot to do her blood work and we will do it on Monday.  He has never had an eye exam and I suggest she get 1.  He is going to vacation in in wants to wait till after her vacation to get her flu vaccine in his she has lost 10 lb since she was here last.  She needs refills on all her medications to include Aldactazide  Crestor  pantoprazole  Vistaril  Pepcid  and vitamin-D and I did inform her that the Vistaril  could increase her risk of dementia.  She denies nausea vomiting diarrhea constipation syncope near-syncope chest pain or shortness for breath.  Historical Data    Past Medical History:  Past Medical History:   Diagnosis Date    DVT (deep venous thrombosis)     Left Calf    Hypothyroidism     Osteopenia     Tendonitis of wrist, right     Tinnitus of both ears     Vitamin D  deficiency          Past Surgical History:  Past Surgical History:   Procedure Laterality Date    COLONOSCOPY  08/27/2018    Dr. Girard    ESOPHAGOGASTRODUODENOSCOPY  08/27/2018    Dr. Girard JOY DILATION AND CURETTAGE      URETHRAL DILATION      when 58 years old         Allergies:  Allergies[1]  Medications:  Current Outpatient Medications   Medication Sig    aspirin 81 mg Oral Tablet, Chewable Chew 1 Tablet (81 mg total) Daily    CALCIUM 26-VIT D3-MAGNESIUM 15 ORAL Take 2 Tablets by mouth Once a day 2 gummies daily    ergocalciferol , vitamin D2, (DRISDOL ) 1,250 mcg (50,000 unit) Oral Capsule Take 1 Capsule (50,000 Units total) by mouth Every 7 days for 180 days    famotidine  (PEPCID ) 40 mg Oral  Tablet Take 1 Tablet (40 mg total) by mouth Twice daily    hydrOXYzine  pamoate (VISTARIL ) 50 mg Oral Capsule Take 1 Capsule (50 mg total) by mouth Twice per day as needed for Itching    levothyroxine  (SYNTHROID ) 100 mcg Oral Tablet TAKE 1 TABLET BY MOUTH EVERY MORNING    pantoprazole  (PROTONIX ) 40 mg Oral Tablet, Delayed Release (E.C.) Take 1 Tablet (40 mg total) by mouth Daily    rosuvastatin  (CRESTOR ) 10 mg Oral Tablet Take 1 Tablet (10 mg total) by mouth Daily    spironolacton-hydrochlorothiaz (ALDACTAZIDE ) 25-25 mg Oral Tablet Take 1 Tablet by mouth Every other day    sucralfate  (CARAFATE ) 1 gram Oral Tablet Take 1 Tablet (1 g total) by mouth Three times daily before meals (Patient taking differently: Take 1 Tablet (1 g total) by mouth Once per day as needed)     Family History:  Family Medical History:       Problem Relation (Age of Onset)    Elevated Lipids Mother, Father, Sister    Heart Attack Mother  Hypertension (High Blood Pressure) Mother, Father, Sister    Hypothyroidism Mother, Father, Sister    No Known Problems Brother, Maternal Grandmother, Maternal Grandfather, Paternal Grandmother, Paternal Grandfather, Daughter, Son, Maternal Aunt, Maternal Uncle, Paternal Aunt, Paternal Uncle, Other            Social History:  Social History     Socioeconomic History    Marital status: Single   Tobacco Use    Smoking status: Former     Types: Cigarettes    Smokeless tobacco: Never   Vaping Use    Vaping status: Never Used   Substance and Sexual Activity    Alcohol use: Not Currently     Comment: Holiday/special occasion.    Drug use: Never     Social Determinants of Health     Social Connections: Medium Risk (02/12/2023)    Social Connections     SDOH Social Isolation: 3 to 5 times a week           Review of Systems:  Any pertinent Review of Systems as addressed in the HPI above.    Physical Exam:  Vital Signs:  Vitals:    04/05/24 1616   BP: (!) 121/91   Pulse: 84   Resp: 18   Temp: 36.5 C (97.7 F)    TempSrc: Temporal   SpO2: 97%   Weight: 114 kg (252 lb)   Height: 1.702 m (5' 7)   BMI: 39.47     Physical Exam  Vitals and nursing note reviewed.   Constitutional:       General: She is awake.      Appearance: Normal appearance. She is well-developed and well-groomed. She is obese.   HENT:      Head: Normocephalic and atraumatic.      Right Ear: Tympanic membrane, ear canal and external ear normal.      Left Ear: Tympanic membrane, ear canal and external ear normal.      Nose: Nose normal.      Mouth/Throat:      Mouth: Mucous membranes are moist.      Pharynx: Oropharynx is clear.   Eyes:      Extraocular Movements: Extraocular movements intact.      Conjunctiva/sclera: Conjunctivae normal.      Pupils: Pupils are equal, round, and reactive to light.   Cardiovascular:      Rate and Rhythm: Normal rate and regular rhythm.      Pulses:           Carotid pulses are 2+ on the right side and 2+ on the left side.       Radial pulses are 2+ on the right side and 2+ on the left side.        Dorsalis pedis pulses are 2+ on the right side and 2+ on the left side.        Posterior tibial pulses are 2+ on the right side and 2+ on the left side.      Heart sounds: Normal heart sounds, S1 normal and S2 normal.   Pulmonary:      Effort: Pulmonary effort is normal.      Breath sounds: Normal breath sounds. No decreased breath sounds, wheezing, rhonchi or rales.   Abdominal:      General: Abdomen is protuberant. Bowel sounds are normal.      Palpations: Abdomen is soft.      Tenderness: There is no abdominal tenderness. There is no right CVA tenderness or left  CVA tenderness.   Musculoskeletal:         General: Normal range of motion.      Cervical back: Normal range of motion and neck supple.      Right lower leg: No edema.      Left lower leg: No edema.   Skin:     General: Skin is warm and dry.      Capillary Refill: Capillary refill takes less than 2 seconds.   Neurological:      General: No focal deficit present.       Mental Status: She is alert and oriented to person, place, and time. Mental status is at baseline.      Cranial Nerves: Cranial nerves 2-12 are intact.      Sensory: Sensation is intact.      Motor: Motor function is intact.      Coordination: Coordination is intact.      Gait: Gait is intact.   Psychiatric:         Mood and Affect: Mood normal.         Behavior: Behavior normal. Behavior is cooperative.         Thought Content: Thought content normal.         Judgment: Judgment normal.       Assessment:    ICD-10-CM    1. Essential hypertension  I10 LIPID PANEL     HEPATIC FUNCTION PANEL     CBC/DIFF     URINALYSIS, MACROSCOPIC AND MICROSCOPIC W/CULTURE REFLEX     BASIC METABOLIC PANEL     THYROID  STIMULATING HORMONE (SENSITIVE TSH)      2. Hyperlipidemia, mixed  E78.2       3. Mastodynia  N64.4       4. 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 Gastroesophageal reflux disease without esophagitis  K21.9       6. Acquired hypothyroidism  E03.9       7. Chronic midline low back pain without sciatica  M54.50     G89.29       8. Obesity (BMI 35.0-39.9 without comorbidity)  E66.9       9. Hypokalemia  E87.6       10. Vitamin D  deficiency  E55.9       11. Pre-diabetes  R73.03          Plan:  Orders Placed This Encounter    LIPID PANEL    HEPATIC FUNCTION PANEL    CBC/DIFF    URINALYSIS, MACROSCOPIC AND MICROSCOPIC W/CULTURE REFLEX    BASIC METABOLIC PANEL    THYROID  STIMULATING HORMONE (SENSITIVE TSH)    ergocalciferol , vitamin D2, (DRISDOL ) 1,250 mcg (50,000 unit) Oral Capsule    famotidine  (PEPCID ) 40 mg Oral Tablet    hydrOXYzine  pamoate (VISTARIL ) 50 mg Oral Capsule    pantoprazole  (PROTONIX ) 40 mg Oral Tablet, Delayed Release (E.C.)    rosuvastatin  (CRESTOR ) 10 mg Oral Tablet    spironolacton-hydrochlorothiaz (ALDACTAZIDE ) 25-25 mg Oral Tablet     Today I ordered a lipid panel hepatic function panel CBC with diff urinalysis basic metabolic panel thyroid -stimulating hormone to be done in 6 months.  Today I refilled her  vitamin-D for vitamin-D deficiency for her gastroesophageal reflux disease I recommend she elevate the head of the bed and not eating after 7:00 p.m. at night.  I recommend she continue her excellent weight loss.  I refilled her Pepcid  and pantoprazole  for her gastroesophageal reflux disease.  For her  generalized anxiety disorder I refilled her Vistaril  but I did caution her that it may increase her risk of dementia.  For her hyperlipidemia I refilled her Crestor  and I recommend she stay on a heart healthy low-fat low-cholesterol diet and avoid high fructose corn syrup and concentrated sweets.  For her hypertension I refilled her spironolactone  hydrochlorothiazide  and I recommend she limit salt to no more than 2 g a day.  She states she uses him lay in his salt and I said the to be fine.  Limit the total.  More than 50% of the visit was spent counseling and coordinating patient care all questions were answered to his satisfaction of the patient    Return in about 6 months (around 10/04/2024).    Layman Law, DO     Portions of this note may be dictated using voice recognition software or a dictation service. Variances in spelling and vocabulary are possible and unintentional. Not all errors are caught/corrected. Please notify the dino if any discrepancies are noted or if the meaning of any statement is not clear.        [1]   Allergies  Allergen Reactions    Amoxicillin  Other Adverse Reaction (Add comment)     Mouth Blisters/Swelling    Augmentin [Amoxicillin-Pot Clavulanate]      Blisters in mouth    Propoxyphene Nausea/ Vomiting

## 2024-04-05 NOTE — Nursing Note (Signed)
 04/05/24 1615   Recent Weight Change   Have you had a recent unexplained weight loss or gain? N   Health Education and Literacy   How often do you have a problem understanding what is told to you about your medical condition?  Never   Domestic Violence   Because we are aware of abuse and domestic violence today, we ask all patients: Are you being hurt, hit, or frightened by anyone at your home or in your life?  N   Basic Needs   Do you have any basic needs within your home that are not being met? (such as Food, Shelter, [employer], Tranportation, paying for bills and/or medications) N

## 2024-04-05 NOTE — Nursing Note (Signed)
 04/05/24 1616   Depression Screen   Little interest or pleasure in doing things. 0   Feeling down, depressed, or hopeless 0   PHQ 2 Total 0

## 2024-04-06 ENCOUNTER — Other Ambulatory Visit (INDEPENDENT_AMBULATORY_CARE_PROVIDER_SITE_OTHER): Payer: Self-pay | Admitting: Family Medicine

## 2024-04-10 ENCOUNTER — Other Ambulatory Visit: Payer: Self-pay

## 2024-04-10 ENCOUNTER — Ambulatory Visit: Attending: Family Medicine

## 2024-04-10 DIAGNOSIS — I10 Essential (primary) hypertension: Secondary | ICD-10-CM | POA: Insufficient documentation

## 2024-04-10 LAB — CBC WITH DIFF
BASOPHIL #: 0 x10ˆ3/uL (ref 0.00–0.10)
BASOPHIL %: 1 % (ref 0–1)
EOSINOPHIL #: 0.2 x10ˆ3/uL (ref 0.00–0.50)
EOSINOPHIL %: 4 % (ref 1–7)
HCT: 37.4 % (ref 31.2–41.9)
HGB: 13.2 g/dL (ref 10.9–14.3)
LYMPHOCYTE #: 1 x10ˆ3/uL — ABNORMAL LOW (ref 1.10–3.10)
LYMPHOCYTE %: 22 % (ref 16–46)
MCH: 31 pg (ref 24.7–32.8)
MCHC: 35.3 g/dL (ref 32.3–35.6)
MCV: 87.7 fL (ref 75.5–95.3)
MONOCYTE #: 0.4 x10ˆ3/uL (ref 0.20–0.90)
MONOCYTE %: 9 % (ref 4–11)
MPV: 7.6 fL — ABNORMAL LOW (ref 7.9–10.8)
NEUTROPHIL #: 2.9 x10ˆ3/uL (ref 1.90–8.20)
NEUTROPHIL %: 64 % (ref 43–77)
PLATELETS: 250 x10ˆ3/uL (ref 140–440)
RBC: 4.27 x10ˆ6/uL (ref 3.63–4.92)
RDW: 13.2 % (ref 12.3–17.7)
WBC: 4.5 x10ˆ3/uL (ref 3.8–11.8)

## 2024-04-10 LAB — HEPATIC FUNCTION PANEL
ALBUMIN/GLOBULIN RATIO: 1.5 — ABNORMAL HIGH (ref 0.8–1.4)
ALBUMIN: 4.3 g/dL (ref 3.5–5.7)
ALKALINE PHOSPHATASE: 81 U/L (ref 34–104)
ALT (SGPT): 20 U/L (ref 7–52)
AST (SGOT): 17 U/L (ref 13–39)
BILIRUBIN DIRECT: 0.14 md/dL (ref 0.03–0.18)
BILIRUBIN TOTAL: 0.7 mg/dL (ref 0.3–1.0)
BILIRUBIN, INDIRECT: 0.56 mg/dL (ref <=1)
GLOBULIN: 2.8 (ref 2.0–3.5)
PROTEIN TOTAL: 7.1 g/dL (ref 6.4–8.9)

## 2024-04-10 LAB — LIPID PANEL
CHOL/HDL RATIO: 3
CHOLESTEROL: 141 mg/dL (ref <200)
HDL CHOL: 47 mg/dL (ref >=40)
LDL CALC: 74 mg/dL (ref 0–100)
TRIGLYCERIDES: 102 mg/dL (ref <=150)
VLDL CALC: 20 mg/dL (ref 0–50)

## 2024-04-10 LAB — URINALYSIS, MACROSCOPIC
BILIRUBIN: NEGATIVE mg/dL
BLOOD: NEGATIVE mg/dL
GLUCOSE: NEGATIVE mg/dL
KETONES: NEGATIVE mg/dL
LEUKOCYTES: NEGATIVE WBCs/uL
NITRITE: NEGATIVE
PH: 6.5 (ref 5.0–9.0)
PROTEIN: NEGATIVE mg/dL
SPECIFIC GRAVITY: 1.015 (ref 1.002–1.030)
UROBILINOGEN: NORMAL mg/dL

## 2024-04-10 LAB — BASIC METABOLIC PANEL
ANION GAP: 4 mmol/L (ref 4–13)
BUN/CREA RATIO: 15 (ref 6–22)
BUN: 13 mg/dL (ref 7–25)
CALCIUM: 9.5 mg/dL (ref 8.6–10.3)
CHLORIDE: 109 mmol/L — ABNORMAL HIGH (ref 98–107)
CO2 TOTAL: 28 mmol/L (ref 21–31)
CREATININE: 0.85 mg/dL (ref 0.60–1.30)
ESTIMATED GFR: 80 mL/min/1.73mˆ2 (ref >59)
GLUCOSE: 117 mg/dL — ABNORMAL HIGH (ref 74–109)
OSMOLALITY, CALCULATED: 282 mosm/kg (ref 270–290)
POTASSIUM: 4.4 mmol/L (ref 3.5–5.1)
SODIUM: 141 mmol/L (ref 136–145)

## 2024-04-10 LAB — URINALYSIS, MICROSCOPIC
RBCS: 1 /HPF (ref <4)
SQUAMOUS EPITHELIAL: 2 /HPF (ref <28)
WBCS: 2 /HPF (ref <6)

## 2024-04-10 LAB — THYROID STIMULATING HORMONE (SENSITIVE TSH): TSH: 0.345 u[IU]/mL — ABNORMAL LOW (ref 0.450–5.330)

## 2024-05-04 ENCOUNTER — Other Ambulatory Visit (INDEPENDENT_AMBULATORY_CARE_PROVIDER_SITE_OTHER): Payer: Self-pay

## 2024-05-08 ENCOUNTER — Encounter (INDEPENDENT_AMBULATORY_CARE_PROVIDER_SITE_OTHER): Payer: Self-pay

## 2024-05-09 ENCOUNTER — Encounter (INDEPENDENT_AMBULATORY_CARE_PROVIDER_SITE_OTHER): Payer: Self-pay

## 2024-06-03 ENCOUNTER — Other Ambulatory Visit (INDEPENDENT_AMBULATORY_CARE_PROVIDER_SITE_OTHER): Payer: Self-pay | Admitting: Surgery

## 2024-06-04 NOTE — Telephone Encounter (Signed)
 Pharmacy is requesting refill on Carafate , last seen 05/03/23 need an appointment? Lucie Backer, LPN  87/01/7973 92:47

## 2024-06-04 NOTE — Telephone Encounter (Signed)
 1 refill sent, needs appt after. Lucie Backer, LPN  87/01/7973 83:94

## 2024-06-12 ENCOUNTER — Ambulatory Visit (INDEPENDENT_AMBULATORY_CARE_PROVIDER_SITE_OTHER): Payer: Self-pay | Admitting: PHYSICIAN ASSISTANT

## 2024-07-10 ENCOUNTER — Other Ambulatory Visit (INDEPENDENT_AMBULATORY_CARE_PROVIDER_SITE_OTHER): Payer: Self-pay | Admitting: Family Medicine

## 2024-07-31 ENCOUNTER — Ambulatory Visit (INDEPENDENT_AMBULATORY_CARE_PROVIDER_SITE_OTHER): Admitting: PHYSICIAN ASSISTANT

## 2024-08-30 ENCOUNTER — Ambulatory Visit (INDEPENDENT_AMBULATORY_CARE_PROVIDER_SITE_OTHER): Admitting: PHYSICIAN ASSISTANT

## 2024-10-03 ENCOUNTER — Ambulatory Visit (INDEPENDENT_AMBULATORY_CARE_PROVIDER_SITE_OTHER): Payer: Self-pay | Admitting: Family Medicine
